# Patient Record
Sex: Female | Born: 1975
Health system: Southern US, Community
[De-identification: ages and names within clinical notes are randomized; demographics above are authoritative.]

## PROBLEM LIST (undated history)

## (undated) DIAGNOSIS — K219 Gastro-esophageal reflux disease without esophagitis: Secondary | ICD-10-CM

## (undated) DIAGNOSIS — R634 Abnormal weight loss: Secondary | ICD-10-CM

## (undated) DIAGNOSIS — G43909 Migraine, unspecified, not intractable, without status migrainosus: Secondary | ICD-10-CM

## (undated) DIAGNOSIS — D649 Anemia, unspecified: Secondary | ICD-10-CM

## (undated) DIAGNOSIS — M545 Low back pain, unspecified: Secondary | ICD-10-CM

## (undated) DIAGNOSIS — R609 Edema, unspecified: Secondary | ICD-10-CM

## (undated) DIAGNOSIS — F32A Depression, unspecified: Secondary | ICD-10-CM

## (undated) HISTORY — DX: Anemia, unspecified: D64.9

## (undated) HISTORY — DX: Low back pain, unspecified: M54.50

## (undated) HISTORY — PX: TUBAL LIGATION: SHX77

## (undated) HISTORY — DX: Abnormal weight loss: R63.4

## (undated) HISTORY — DX: Migraine, unspecified, not intractable, without status migrainosus: G43.909

## (undated) HISTORY — DX: Depression, unspecified: F32.A

## (undated) HISTORY — DX: Edema, unspecified: R60.9

## (undated) HISTORY — DX: Gastro-esophageal reflux disease without esophagitis: K21.9

---

## 2001-03-19 ENCOUNTER — Emergency Department (HOSPITAL_COMMUNITY): Admission: EM | Admit: 2001-03-19 | Discharge: 2001-03-19 | Payer: Self-pay | Admitting: *Deleted

## 2014-04-02 ENCOUNTER — Ambulatory Visit: Payer: Self-pay | Admitting: Podiatrist

## 2016-04-12 DIAGNOSIS — L84 Corns and callosities: Secondary | ICD-10-CM | POA: Insufficient documentation

## 2016-04-12 DIAGNOSIS — M2141 Flat foot [pes planus] (acquired), right foot: Secondary | ICD-10-CM | POA: Insufficient documentation

## 2016-04-12 DIAGNOSIS — M2012 Hallux valgus (acquired), left foot: Secondary | ICD-10-CM | POA: Insufficient documentation

## 2016-07-29 DIAGNOSIS — E785 Hyperlipidemia, unspecified: Secondary | ICD-10-CM | POA: Insufficient documentation

## 2016-07-29 DIAGNOSIS — R001 Bradycardia, unspecified: Secondary | ICD-10-CM | POA: Insufficient documentation

## 2021-12-18 ENCOUNTER — Other Ambulatory Visit: Payer: Self-pay

## 2021-12-18 ENCOUNTER — Emergency Department
Admission: EM | Admit: 2021-12-18 | Discharge: 2021-12-18 | Disposition: A | Discharge status: Home / Self Care | Payer: 59 | Attending: Emergency Medicine | Admitting: Emergency Medicine

## 2021-12-18 DIAGNOSIS — B372 Candidiasis of skin and nail: Secondary | ICD-10-CM | POA: Diagnosis present

## 2021-12-18 DIAGNOSIS — F17210 Nicotine dependence, cigarettes, uncomplicated: Secondary | ICD-10-CM | POA: Diagnosis not present

## 2021-12-18 MED ORDER — CLOTRIMAZOLE-BETAMETHASONE 1-0.05 % EX CREA: 1.0000 "application " | TOPICAL_CREAM | Freq: Two times a day (BID) | CUTANEOUS | 0 refills | Status: DC

## 2021-12-18 NOTE — Discharge Instructions (Addendum)
Use antibiotic cleanser given upon discharge twice a day.  Apply lotrisone twice a day and keep bandage.

## 2021-12-18 NOTE — ED Triage Notes (Signed)
Pt c/o red raised area to the right side of her abd for the past 3 days, states it blisters up and is draining

## 2021-12-18 NOTE — ED Provider Notes (Signed)
[facility] Emergency Department Provider Note   ____________________________________________   Event Date/Time   First MD Initiated Contact with Patient 12/18/21 1011     (approximate)  I have reviewed the triage vital signs and the nursing notes.   HISTORY  Chief Complaint Abscess    HPI Margaret Myers is a 46 y.o. female patient complain of red area to the right side of her abdomen for the past 3 days.  Patient state increased itching and pain.         History reviewed. No pertinent past medical history.  There are no problems to display for this patient.   Past Surgical History:  Procedure Laterality Date   TUBAL LIGATION      Prior to Admission medications   Medication Sig Start Date End Date Taking? Authorizing Provider  clotrimazole-betamethasone (LOTRISONE) cream Apply 1 application topically 2 (two) times daily. 12/18/21  Yes Joni Reining, PA-C    Allergies Patient has no known allergies.  No family history on file.  Social History Social History   Tobacco Use   Smoking status: Every Day    Types: Cigarettes   Smokeless tobacco: Never  Substance Use Topics   Alcohol use: Yes   Drug use: Not Currently    Review of Systems  Constitutional: No fever/chills Eyes: No visual changes. ENT: No sore throat. Cardiovascular: Denies chest pain. Respiratory: Denies shortness of breath. Gastrointestinal: No abdominal pain.  No nausea, no vomiting.  No diarrhea.  No constipation. Genitourinary: Negative for dysuria. Musculoskeletal: Negative for back pain. Skin: Positive for rash. Neurological: Negative for headaches, focal weakness or numbness.   ____________________________________________   PHYSICAL EXAM:  VITAL SIGNS: ED Triage Vitals  Enc Vitals Group     BP 12/18/21 0943 128/78     Pulse Rate 12/18/21 0943 72     Resp 12/18/21 0943 18     Temp 12/18/21 0943 98.3 F (36.8 C)     Temp Source 12/18/21 0943  Oral     SpO2 12/18/21 0943 97 %     Weight 12/18/21 0944 246 lb (111.6 kg)     Height 12/18/21 0944 5\' 3"  (1.6 m)     Head Circumference --      Peak Flow --      Pain Score 12/18/21 0944 5     Pain Loc --      Pain Edu? --      Excl. in GC? --    Constitutional: Alert and oriented. Well appearing and in no acute distress. Eyes: Conjunctivae are normal. PERRL. EOMI. Head: Atraumatic. Nose: No congestion/rhinnorhea. Mouth/Throat: Mucous membranes are moist.  Oropharynx non-erythematous. Neck: No stridor.  No cervical spine tenderness to palpation. Hematological/Lymphatic/Immunilogical: No cervical lymphadenopathy. Cardiovascular: Normal rate, regular rhythm. Grossly normal heart sounds.  Good peripheral circulation. Respiratory: Normal respiratory effort.  No retractions. Lungs CTAB. Gastrointestinal: Soft and nontender. No distention. No abdominal bruits. No CVA tenderness. Genitourinary:  Musculoskeletal: No lower extremity tenderness nor edema.  No joint effusions. Neurologic:  Normal speech and language. No gross focal neurologic deficits are appreciated. No gait instability. Skin: Erythematous macular lesion abdominal skin fold of abdomen.   Psychiatric: Mood and affect are normal. Speech and behavior are normal.  ____________________________________________   LABS (all labs ordered are listed, but only abnormal results are displayed)  Labs Reviewed - No data to display ____________________________________________  EKG   ____________________________________________  RADIOLOGY 12/20/21, personally viewed and evaluated these images (plain radiographs) as  part of my medical decision making, as well as reviewing the written report by the radiologist.  ED MD interpretation:    Official radiology report(s): No results found.  ____________________________________________   PROCEDURES  Procedure(s) performed (including Critical  Care):  Procedures   ____________________________________________   INITIAL IMPRESSION / ASSESSMENT AND PLAN / ED COURSE  As part of my medical decision making, I reviewed the following data within the electronic MEDICAL RECORD NUMBER  Patient presents with erythematous macular lesion right abdominal skin fold consistent with  Candida infection of the skin.  Patient given discharge care instructions and a prescription for Lotrisone.  Patient advised follow-up [facility].         ____________________________________________   FINAL CLINICAL IMPRESSION(S) / ED DIAGNOSES  Final diagnoses:  Candida infection of flexural skin     ED Discharge Orders          Ordered    clotrimazole-betamethasone (LOTRISONE) cream  2 times daily        12/18/21 1032             Note:  This document was prepared using Dragon voice recognition software and may include unintentional dictation errors.    Joni Reining, PA-C 12/18/21 1040    Dionne Bucy, MD 12/18/21 1335

## 2022-03-12 ENCOUNTER — Other Ambulatory Visit: Payer: Self-pay

## 2022-03-12 ENCOUNTER — Emergency Department
Admission: EM | Admit: 2022-03-12 | Discharge: 2022-03-12 | Disposition: A | Discharge status: Home / Self Care | Payer: 59 | Attending: Emergency Medicine | Admitting: Emergency Medicine

## 2022-03-12 ENCOUNTER — Emergency Department: Payer: 59

## 2022-03-12 ENCOUNTER — Encounter: Payer: Self-pay | Admitting: Emergency Medicine

## 2022-03-12 DIAGNOSIS — W010XXA Fall on same level from slipping, tripping and stumbling without subsequent striking against object, initial encounter: Secondary | ICD-10-CM | POA: Diagnosis not present

## 2022-03-12 DIAGNOSIS — W19XXXA Unspecified fall, initial encounter: Secondary | ICD-10-CM

## 2022-03-12 DIAGNOSIS — M25561 Pain in right knee: Secondary | ICD-10-CM | POA: Insufficient documentation

## 2022-03-12 DIAGNOSIS — Y92009 Unspecified place in unspecified non-institutional (private) residence as the place of occurrence of the external cause: Secondary | ICD-10-CM | POA: Insufficient documentation

## 2022-03-12 DIAGNOSIS — G8929 Other chronic pain: Secondary | ICD-10-CM | POA: Insufficient documentation

## 2022-03-12 MED ORDER — ACETAMINOPHEN 500 MG PO TABS
1000.0000 mg | ORAL_TABLET | Freq: Once | ORAL | Status: AC
  Administered 2022-03-12: 1000 mg via ORAL
  Filled 2022-03-12: qty 2

## 2022-03-12 MED ORDER — NAPROXEN 500 MG PO TABS
500.0000 mg | ORAL_TABLET | Freq: Once | ORAL | Status: AC
  Administered 2022-03-12: 500 mg via ORAL
  Filled 2022-03-12: qty 1

## 2022-03-12 NOTE — Discharge Instructions (Signed)
Please take Tylenol and ibuprofen/Advil for your pain.  It is safe to take them together, or to alternate them every few hours.  Take up to 1000mg of Tylenol at a time, up to 4 times per day.  Do not take more than 4000 mg of Tylenol in 24 hours.  For ibuprofen, take 400-600 mg, 4-5 times per day. ° ° °

## 2022-03-12 NOTE — ED Provider Notes (Signed)
? ?[facility] ?Provider Note ? ? ? Event Date/Time  ? First MD Initiated Contact with Patient 03/12/22 0058   ?  (approximate) ? ? ?History  ? ?Fall (/) ? ? ?HPI ? ?Margaret Myers is a 46 y.o. female who presents to the ED for evaluation of Fall (/) ?  ?Morbidly obese patient.  ? ?Patient presents to the ED for evaluation of acute on chronic right knee and lumbar pain after a fall that occurred the morning of 4/6.  She reports a mechanical fall, tripping over a raised threshold in a doorway, causing her to fall to the ground.  Denies head trauma or syncope.  Reports that she has been ambulatory since the event, but has had increasing acute on chronic pain to her right knee and lumbar back over the past 12-15 hours.  She presents with her husband who reports that she has been complaining quite a bit this afternoon, and due to her complaints just told her to come get checked out. ? ? ?Physical Exam  ? ?Triage Vital Signs: ?ED Triage Vitals  ?Enc Vitals Group  ?   BP 03/12/22 0056 115/68  ?   Pulse Rate 03/12/22 0056 87  ?   Resp 03/12/22 0056 16  ?   Temp 03/12/22 0056 97.9 ?F (36.6 ?C)  ?   Temp Source 03/12/22 0056 Oral  ?   SpO2 03/12/22 0056 98 %  ?   Weight 03/12/22 0055 240 lb (108.9 kg)  ?   Height 03/12/22 0055 5\' 3"  (1.6 m)  ?   Head Circumference --   ?   Peak Flow --   ?   Pain Score 03/12/22 0054 8  ?   Pain Loc --   ?   Pain Edu? --   ?   Excl. in GC? --   ? ? ?Most recent vital signs: ?Vitals:  ? 03/12/22 0056  ?BP: 115/68  ?Pulse: 87  ?Resp: 16  ?Temp: 97.9 ?F (36.6 ?C)  ?SpO2: 98%  ? ? ?General: Awake, no distress.  Morbidly obese.  Ambulatory back to the room independently with a normal and smooth gait. ?CV:  Good peripheral perfusion.  ?Resp:  Normal effort.  ?Abd:  No distention.  ?MSK:  No deformity noted.  No external signs of trauma.  Right knee has some mild tenderness to the medial aspect without point bony tenderness or joint line tenderness.  No lateral tenderness to  palpation over the proximal femur.  ?Back is similarly without signs of external trauma.  No bruising or step-offs.  No point bony tenderness.  Diffuse and bilateral paraspinal lumbar tenderness to palpation. ?Neuro:  No focal deficits appreciated. Cranial nerves II through XII intact ?5/5 strength and sensation in all 4 extremities ?Other:   ? ? ?ED Results / Procedures / Treatments  ? ?Labs ?(all labs ordered are listed, but only abnormal results are displayed) ?Labs Reviewed - No data to display ? ?EKG ? ? ?RADIOLOGY ?Plain films of the right knee and lumbar spine reviewed by me without evidence of fracture or dislocation. ? ?Official radiology report(s): ?DG Lumbar Spine Complete ? ?Result Date: 03/12/2022 ?CLINICAL DATA:  Fall with acute on chronic pain EXAM: LUMBAR SPINE - COMPLETE 4+ VIEW COMPARISON:  07/02/2015 FINDINGS: There is no evidence of lumbar spine fracture. Alignment is normal. Intervertebral disc spaces are maintained. IMPRESSION: Negative. Electronically Signed   By: 07/04/2015 M.D.   On: 03/12/2022 02:03  ? ?DG Knee Complete 4  Views Right ? ?Result Date: 03/12/2022 ?CLINICAL DATA:  Fall EXAM: RIGHT KNEE - COMPLETE 4+ VIEW COMPARISON:  None. FINDINGS: There is an irregular osseous fragment adjacent to the anterior surface of the lateral femoral condyle. No joint effusion. IMPRESSION: Irregular osseous fragment adjacent to the anterior surface of the lateral femoral condyle, indeterminate. Lack of joint effusion argues against acute fracture. If clinical concern for fracture, CT may be helpful. Electronically Signed   By: Deatra Robinson M.D.   On: 03/12/2022 02:02   ? ?PROCEDURES and INTERVENTIONS: ? ?Procedures ? ?Medications  ?acetaminophen (TYLENOL) tablet 1,000 mg (1,000 mg Oral Given 03/12/22 0124)  ?naproxen (NAPROSYN) tablet 500 mg (500 mg Oral Given 03/12/22 0124)  ? ? ? ?IMPRESSION / MDM / ASSESSMENT AND PLAN / ED COURSE  ?I reviewed the triage vital signs and the nursing notes. ? ?46 year old  female presents to the ED with acute on chronic MSK pain after a fall that occurred nearly 24 hours ago, without evidence of acute fracture or significant acute injuries, and suitable for outpatient management.  She look systemically well and is ambulatory with a normal gait.  Has complaints of acute on chronic pain after mechanical fall.  No syncope or signs of seizure.  Plain films of the affected areas without evidence of fracture.  Plain film of the right knee does question a possible fracture fragment of the lateral condyle of the femur, but does not clinically correlate considering her lack of lateral tenderness on palpation.  Overall reassuring evaluation and she is suitable for outpatient management.  We discussed multimodal nonnarcotic analgesia at home and return precautions for the ED. ? ?Clinical Course as of 03/12/22 0225  ?Fri Mar 12, 2022  ?0212 Reassessed and discussed x-ray results.  Reexamined and discussed the x-ray findings and lesser likelihood of a fracture considering she has no lateral pain.  She reports understanding and agreement with plan of care. [DS]  ?  ?Clinical Course User Index ?[DS] Delton Prairie, MD  ? ? ? ?FINAL CLINICAL IMPRESSION(S) / ED DIAGNOSES  ? ?Final diagnoses:  ?Fall, initial encounter  ?Chronic pain of right knee  ? ? ? ?Rx / DC Orders  ? ?ED Discharge Orders   ? ? None  ? ?  ? ? ? ?Note:  This document was prepared using Dragon voice recognition software and may include unintentional dictation errors. ?  ?Delton Prairie, MD ?03/12/22 [phone] ? ?

## 2022-03-12 NOTE — ED Triage Notes (Signed)
Pt to ED from home c/o fall tonight.  States missed a small step at home, denies hitting head or LOC.  Pain to lower back with hx of degenerative discs and also right knee pain. ?

## 2022-03-12 NOTE — ED Notes (Signed)
Patient transported to X-ray 

## 2022-03-23 ENCOUNTER — Telehealth: Payer: Self-pay

## 2022-03-23 NOTE — Telephone Encounter (Signed)
Copied from CRM #[id number]. Topic: Medical Record Request - Other ?>> Mar 23, 2022  3:04 PM Pawlus, Monica A wrote: ?Pt called in to see if the office has received his medical recrods from  [facility], please advise. ?

## 2022-03-23 NOTE — Telephone Encounter (Signed)
Copied from CRM #[id number]. Topic: Medical Record Request - Other ?>> Mar 23, 2022  3:04 PM Pawlus, Monica A wrote: ?Pt called in to see if the office has received his medical recrods from [facility], please advise. ?

## 2022-03-24 ENCOUNTER — Telehealth: Payer: Self-pay

## 2022-03-24 NOTE — Telephone Encounter (Signed)
Copied from [place] [id number]. Topic: Medical Record Request - Other ?>> Mar 23, 2022  3:04 PM Pawlus, Margaret Myers wrote: ?Pt called in to see if the office has received his medical recrods from [facility], please advise. ?

## 2022-03-26 ENCOUNTER — Telehealth: Payer: Self-pay | Admitting: Physician Assistant

## 2022-03-26 NOTE — Telephone Encounter (Signed)
Pt states [facility] sent the records were re faxed. ?

## 2022-03-26 NOTE — Telephone Encounter (Signed)
Returned patient's phone call, informing her that our office hasn't received medical Records from [facility]. Patient stated that she would follow up with [facility]. ?

## 2022-04-20 ENCOUNTER — Telehealth: Payer: Self-pay | Admitting: Pharmacy Technician

## 2022-04-20 NOTE — Telephone Encounter (Signed)
Patient only signed DOH Attestation.  Would need to provide current year's household income if PAP medications were needed. ? ?Margaret Harold J. [name] ?Patient Advocate Specialist ?[facility]  ?

## 2022-04-21 ENCOUNTER — Telehealth: Payer: Self-pay

## 2022-04-21 NOTE — Telephone Encounter (Signed)
Patient's boyfriend called and says the patient went to the ER a couple days ago in [place] for knee pain and was given a prescription for a knee brace. I asked if the patient is there to give permission to speak to him, she says yes she gives permission. He says they went to [facility] to get the brace and was told they can't take the Rx that the hospital gave per her insurance, she will need a Rx from her primary. I advised patient is not an established patient at [facility] where her records have been sent and where she has a NP appointment, advised her to go to [facility] for evaluation and they would be able to provide what is needed, advised this is an UC so she will have the same workup done. Patient verbalized she's ok with that. ?

## 2022-04-22 NOTE — Telephone Encounter (Signed)
Attempted to reach pt/recording "call cannot be completed as dialed."  If pt r/t's call please advise she should look up medical devise supply stores and check if they take her ins.  We are unable to give medical advise until she is an established pt.

## 2022-04-22 NOTE — Telephone Encounter (Signed)
Called again to find out where pt can go for a knee Brace / they went to [facility] and they do not take her Friday health insurance / they need assistance on where to go until she can get in for her NP appt / please advise asap

## 2022-04-24 ENCOUNTER — Ambulatory Visit (HOSPITAL_BASED_OUTPATIENT_CLINIC_OR_DEPARTMENT_OTHER): Payer: Self-pay | Admitting: Physical Therapy

## 2022-04-26 ENCOUNTER — Other Ambulatory Visit: Payer: Self-pay | Admitting: Physician Assistant

## 2022-04-27 ENCOUNTER — Emergency Department
Admission: EM | Admit: 2022-04-27 | Discharge: 2022-04-27 | Disposition: A | Discharge status: Home / Self Care | Payer: 59 | Attending: Emergency Medicine | Admitting: Emergency Medicine

## 2022-04-27 ENCOUNTER — Encounter: Payer: Self-pay | Admitting: Emergency Medicine

## 2022-04-27 DIAGNOSIS — R21 Rash and other nonspecific skin eruption: Secondary | ICD-10-CM | POA: Diagnosis present

## 2022-04-27 DIAGNOSIS — B372 Candidiasis of skin and nail: Secondary | ICD-10-CM | POA: Insufficient documentation

## 2022-04-27 MED ORDER — CLOTRIMAZOLE-BETAMETHASONE 1-0.05 % EX CREA
TOPICAL_CREAM | Freq: Two times a day (BID) | CUTANEOUS | Status: DC
  Filled 2022-04-27: qty 15

## 2022-04-27 MED ORDER — CLOTRIMAZOLE-BETAMETHASONE 1-0.05 % EX CREA: 1.0000 "application " | TOPICAL_CREAM | Freq: Two times a day (BID) | CUTANEOUS | 0 refills | Status: DC

## 2022-04-27 NOTE — ED Provider Notes (Signed)
[facility] Provider Note   Event Date/Time   First MD Initiated Contact with Patient 04/27/22 2125     (approximate) History  Rash  HPI Margaret Myers is a 46 y.o. female with stated past medical history of chronic knee pain and recurrence of abdominal rash who presents for recurrence of this abdominal rash.  Patient has area of erythema and maculopapular raised lesions over the right upper quadrant abdominal skin as well as overlying the anterior chest.  Patient states that she was initially given Lotrisone cream for this rash that resolved symptoms however after she ran out of the cream this rash recurred.  Patient denies any fevers, known allergens, food or travel out of the ordinary.  Patient does state that this rash significantly resolved when she was on vacation in [place] and recurred when she came home.  Patient does not have any known mold, fungal growth, or allergens in her house Physical Exam  Triage Vital Signs: ED Triage Vitals  Enc Vitals Group     BP 04/27/22 2054 (!) 106/42     Pulse Rate 04/27/22 2054 97     Resp 04/27/22 2054 17     Temp 04/27/22 2054 98.5 F (36.9 C)     Temp Source 04/27/22 2054 Oral     SpO2 04/27/22 2054 100 %     Weight 04/27/22 2115 268 lb (121.6 kg)     Height 04/27/22 2115 5\' 3"  (1.6 m)     Head Circumference --      Peak Flow --      Pain Score 04/27/22 2116 5     Pain Loc --      Pain Edu? --      Excl. in GC? --    Most recent vital signs: Vitals:   04/27/22 2054 04/27/22 2235  BP: (!) 106/42 (!) 149/89  Pulse: 97 89  Resp: 17 20  Temp: 98.5 F (36.9 C) 97.6 F (36.4 C)  SpO2: 100% 96%   General: Awake, oriented x4. CV:  Good peripheral perfusion.  Resp:  Normal effort.  Abd:  No distention.  Other:  Middle-aged obese Caucasian female laying in bed in no distress with a maculopapular rash on an erythematous base to the anterior chest and right upper quadrant abdominal wall ED Results / Procedures /  Treatments  PROCEDURES: Critical Care performed: No Procedures MEDICATIONS ORDERED IN ED: Medications  clotrimazole-betamethasone (LOTRISONE) cream ( Topical Given 04/27/22 2234)   IMPRESSION / MDM / ASSESSMENT AND PLAN / ED COURSE  I reviewed the triage vital signs and the nursing notes.                              Patient is non-toxic appearing and well hydrated. Ddx: Patients symptoms not typical for other emergent causes of rash such as cellulitis, abscess, necrotizing fasciitis, vasculitis, anaphylaxis, SJS or TENS. Appears to be recurrence of fungal infection from unknown source.  Patient states that the Lotrisone she was given previously resolved this rash and therefore will give her prescription for Lotrisone again as well as dermatology follow-up. Disposition: Patient will be discharged with strict return precautions and follow up with pediatrician within 24-48 hours for further evaluation.    FINAL CLINICAL IMPRESSION(S) / ED DIAGNOSES   Final diagnoses:  Candida infection of flexural skin   Rx / DC Orders   ED Discharge Orders  Ordered    clotrimazole-betamethasone (LOTRISONE) cream  2 times daily        04/27/22 2224           Note:  This document was prepared using Dragon voice recognition software and may include unintentional dictation errors.   Merwyn Katos, MD 04/27/22 [phone]

## 2022-04-27 NOTE — ED Triage Notes (Signed)
Pt presents with a rash on her chest and stomach - mild erythema. She notes she was seen here for it before and received a topically cream which helped but she is out of the medication. She has tried OTC benadryl without improvement to her itching. Respirations are equal and unlabored.

## 2022-06-01 ENCOUNTER — Encounter: Payer: Self-pay | Admitting: Physician Assistant

## 2022-06-01 ENCOUNTER — Ambulatory Visit (INDEPENDENT_AMBULATORY_CARE_PROVIDER_SITE_OTHER): Payer: 59 | Admitting: Physician Assistant

## 2022-06-01 VITALS — BP 115/56 | HR 61 | Temp 98.1°F | Resp 16 | Ht 63.0 in | Wt 271.5 lb

## 2022-06-01 DIAGNOSIS — F32A Depression, unspecified: Secondary | ICD-10-CM

## 2022-06-01 DIAGNOSIS — E785 Hyperlipidemia, unspecified: Secondary | ICD-10-CM | POA: Diagnosis not present

## 2022-06-01 DIAGNOSIS — F419 Anxiety disorder, unspecified: Secondary | ICD-10-CM | POA: Diagnosis not present

## 2022-06-01 DIAGNOSIS — M25561 Pain in right knee: Secondary | ICD-10-CM

## 2022-06-01 DIAGNOSIS — F325 Major depressive disorder, single episode, in full remission: Secondary | ICD-10-CM

## 2022-06-01 DIAGNOSIS — G8929 Other chronic pain: Secondary | ICD-10-CM

## 2022-06-01 DIAGNOSIS — K219 Gastro-esophageal reflux disease without esophagitis: Secondary | ICD-10-CM

## 2022-06-01 DIAGNOSIS — E1169 Type 2 diabetes mellitus with other specified complication: Secondary | ICD-10-CM

## 2022-06-01 DIAGNOSIS — K2100 Gastro-esophageal reflux disease with esophagitis, without bleeding: Secondary | ICD-10-CM

## 2022-06-01 DIAGNOSIS — M545 Low back pain, unspecified: Secondary | ICD-10-CM

## 2022-06-01 MED ORDER — GABAPENTIN 100 MG PO CAPS: ORAL_CAPSULE | ORAL | 0 refills | Status: DC

## 2022-06-01 MED ORDER — OMEPRAZOLE 20 MG PO CPDR: 20.0000 mg | DELAYED_RELEASE_CAPSULE | Freq: Every day | ORAL | 3 refills | Status: DC

## 2022-06-01 MED ORDER — GABAPENTIN 100 MG PO CAPS: 100.0000 mg | ORAL_CAPSULE | Freq: Three times a day (TID) | ORAL | 3 refills | Status: DC

## 2022-06-01 MED ORDER — CITALOPRAM HYDROBROMIDE 10 MG PO TABS: 10.0000 mg | ORAL_TABLET | Freq: Every day | ORAL | 3 refills | Status: DC

## 2022-06-01 NOTE — Progress Notes (Unsigned)
I,Brittiany Wiehe Robinson,acting as a Neurosurgeon for [employer], PA-C.,have documented all relevant documentation on the behalf of [name], PA-C,as directed by  [employer], PA-C while in the presence of [employer], PA-C.  New Patient Office Visit  Subjective    Patient ID: Margaret Myers, female    DOB: 03-Apr-1976  Age: 46 y.o. MRN: [id number]  CC:  Chief Complaint  Patient presents with   Establish Care    HPI Margaret Myers is a 46 yr old female presenting to establish care.  Patient states "I need to get back on all my meds." Recently moved from [place] and has not been on meds for approximately two years.  Gabapentin for knee pain. Citalopram, Buspirone, Omeprazole.  Reports anxiety and depression.  Recently lost mother, brother and fiance.  Having a difficult time coping.   Also complains of right knee pain. Reports "bones are rubbing together" Patient is not seeing anyone for this.  Supposed to have surgery for her knee. Last saw a medicatl provider 6 mo ago. Onset yrs ago.  States she felt in a hole in her yard and knee was injured.  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    clotrimazole-betamethasone (LOTRISONE) cream Apply 1 application. topically 2 (two) times daily.   gabapentin (NEURONTIN) 100 MG capsule 1 tab at bedtime   omeprazole (PRILOSEC) 20 MG capsule Take 1 capsule (20 mg total) by mouth daily.   [DISCONTINUED] gabapentin (NEURONTIN) 100 MG capsule Take 1 capsule (100 mg total) by mouth 3 (three) times daily.   No facility-administered encounter medications on file as of 06/01/2022.    Past Medical History:  Diagnosis Date   Anemia    Depression     Past Surgical History:  Procedure Laterality Date   TUBAL LIGATION     TUBAL LIGATION      Family History  Problem Relation Age of Onset   Depression Mother    COPD Mother    Cancer Mother    Cancer Father    Heart failure Brother     Social History   Socioeconomic History   Marital status: Divorced    Spouse name: Not on file   Number of children: Not on file   Years of education: Not on file   Highest education level: Not on file  Occupational History   Not on file  Tobacco Use   Smoking status: Every Day    Types: Cigarettes   Smokeless tobacco: Never  Substance and Sexual Activity   Alcohol use: Yes   Drug use: Not Currently   Sexual  activity: Yes    Birth control/protection: Surgical  Other Topics Concern   Not on file  Social History Narrative   ** Merged History Encounter **       Social Determinants of Health   Financial Resource Strain: Not on file  Food Insecurity: Not on file  Transportation Needs: Not on file  Physical Activity: Not on file  Stress: Not on file  Social Connections: Not on file  Intimate Partner Violence: Not on file    Review of Systems  Cardiovascular:  Positive for leg swelling.  Musculoskeletal:  Positive for back pain, joint pain and myalgias.  Neurological:  Positive for headaches.  Psychiatric/Behavioral:  Positive for depression. The patient is nervous/anxious.   All other systems reviewed and are negative.    Objective     BP (!) 115/56 (BP Location: Right Arm, Patient Position: Sitting, Cuff Size: Large)   Pulse 61   Temp 98.1 F (36.7 C) (Oral)   Resp 16   Ht 5\' 3"  (1.6 m)   Wt 271 lb 8 oz (123.2 kg)   SpO2 100%   BMI 48.09 kg/m   Physical Exam Vitals reviewed.  Constitutional:      General: She is not in acute distress.    Appearance: Normal appearance. She is well-developed. She is obese. She is not diaphoretic.  HENT:     Head: Normocephalic and atraumatic.     Right Ear: Tympanic membrane, ear canal and external ear normal.     Left Ear: Tympanic membrane, ear canal and external ear normal.     Nose: Nose normal.     Mouth/Throat:     Mouth: Mucous membranes are moist.     Pharynx: Oropharynx is clear. No oropharyngeal exudate or posterior oropharyngeal erythema.  Eyes:     General: No scleral icterus.    Extraocular Movements: Extraocular movements intact.     Conjunctiva/sclera: Conjunctivae normal.     Pupils: Pupils are equal, round, and reactive to light.  Neck:     Thyroid: No thyromegaly.  Cardiovascular:     Rate and Rhythm: Normal rate and regular rhythm.     Pulses: Normal pulses.     Heart sounds: Normal heart sounds. No murmur heard. Pulmonary:     Effort: Pulmonary effort is normal. No respiratory distress.     Breath sounds: Normal breath sounds. No wheezing, rhonchi or rales.  Musculoskeletal:        General: Normal range of motion.     Cervical back: Normal range of motion and neck supple.     Right lower leg: Edema (mild, chronic) present.     Left lower leg: Edema (mild, chronic) present.  Lymphadenopathy:     Cervical: No cervical adenopathy.  Skin:    General: Skin is warm and dry.     Capillary Refill: Capillary refill takes less than 2 seconds.     Findings: No rash.  Neurological:     General: No focal deficit present.     Mental Status: She is alert and oriented to person, place, and time. Mental status is at baseline.  Psychiatric:        Mood and  Affect: Mood normal.        Behavior: Behavior normal.        Thought Content: Thought content normal.        Judgment: Judgment normal.         Assessment & Plan:   Problem List Items Addressed This Visit  None Visit Diagnoses     Hyperlipidemia associated with type 2 diabetes mellitus (HCC)    -  Primary   Relevant Orders   CBC with Differential/Platelet (Completed)   Comprehensive metabolic panel (Completed)   Lipid panel (Completed)   Chronic pain of right knee       Relevant Medications   gabapentin (NEURONTIN) 100 MG capsule   Depression, major, single episode, complete remission (HCC)       Relevant Medications   citalopram (CELEXA) 10 MG tablet   Anxiety       Relevant Medications   citalopram (CELEXA) 10 MG tablet   Gastroesophageal reflux disease with esophagitis, unspecified whether hemorrhage       Relevant Medications   omeprazole (PRILOSEC) 20 MG capsule   Chronic bilateral low back pain without sciatica       Relevant Medications   citalopram (CELEXA) 10 MG tablet   gabapentin (NEURONTIN) 100 MG capsule   Morbid obesity (HCC)       Relevant Orders   CBC with Differential/Platelet (Completed)   Comprehensive metabolic panel (Completed)   TSH (Completed)   Lipid panel (Completed)      1. Hyperlipidemia associated with type 2 diabetes mellitus (HCC)  - CBC with Differential/Platelet - Comprehensive metabolic panel - Lipid panel  2. Chronic pain of right knee Continue her current regimen - gabapentin (NEURONTIN) 100 MG capsule; Take 1 capsule (100 mg total) by mouth 3 (three) times daily.  Dispense: 90 capsule; Refill: 3  3. Depression, major, single episode, complete remission (HCC) Continue her current medication regimen - citalopram (CELEXA) 10 MG tablet; Take 1 tablet (10 mg total) by mouth daily.  Dispense: 30 tablet; Refill: 3  4. Anxiety Chronic. Stable - citalopram (CELEXA) 10 MG tablet; Take 1 tablet (10 mg total) by mouth daily.   Dispense: 30 tablet; Refill: 3  5. Gastroesophageal reflux disease with esophagitis, unspecified whether hemorrhage Chronic. Stable. - omeprazole (PRILOSEC) 20 MG capsule; Take 1 capsule (20 mg total) by mouth daily.  Dispense: 30 capsule; Refill: 3 Elevate the head of the bed 6-8 inches, avoid recumbency for 3 hours after eating, avoid food as a delayed gastric emptying, weight loss   6. Chronic bilateral low back pain without sciatica Chronic. Stable. - gabapentin (NEURONTIN) 100 MG capsule; Take 1 capsule (100 mg total) by mouth 3 (three) times daily.  Dispense: 90 capsule; Refill: 3  7. Morbid obesity (HCC) BMI 48.09 - CBC with Differential/Platelet - Comprehensive metabolic panel - TSH - Lipid panel  FU in 2 weeks. The patient was advised to call back or seek an in-person evaluation if the symptoms worsen or if the condition fails to improve as anticipated.  I discussed the assessment and treatment plan with the patient. The patient was provided an opportunity to ask questions and all were answered. The patient agreed with the plan and demonstrated an understanding of the instructions.  The entirety of the information documented in the History of Present Illness, Review of Systems and Physical Exam were personally obtained by me. Portions of this information were initially documented by the CMA and reviewed by me for thoroughness and accuracy.  Portions of this note were created using dictation software and may contain typographical errors.   [name], [facility], MMS [facility] [phone] (phone) [phone] (fax)\

## 2022-06-02 LAB — LIPID PANEL
Chol/HDL Ratio: 3.7 ratio (ref 0.0–4.4)
Cholesterol, Total: 199 mg/dL (ref 100–199)
HDL: 54 mg/dL (ref >39)
LDL Chol Calc (NIH): 126 mg/dL — ABNORMAL HIGH (ref 0–99)
Triglycerides: 108 mg/dL (ref 0–149)
VLDL Cholesterol Cal: 19 mg/dL (ref 5–40)

## 2022-06-02 LAB — CBC WITH DIFFERENTIAL/PLATELET
Basophils Absolute: 0 10*3/uL (ref 0.0–0.2)
Basos: 1 %
EOS (ABSOLUTE): 0.2 10*3/uL (ref 0.0–0.4)
Eos: 3 %
Hematocrit: 37.9 % (ref 34.0–46.6)
Hemoglobin: 13.6 g/dL (ref 11.1–15.9)
Immature Grans (Abs): 0 10*3/uL (ref 0.0–0.1)
Immature Granulocytes: 0 %
Lymphocytes Absolute: 1.7 10*3/uL (ref 0.7–3.1)
Lymphs: 29 %
MCH: 32 pg (ref 26.6–33.0)
MCHC: 35.9 g/dL — ABNORMAL HIGH (ref 31.5–35.7)
MCV: 89 fL (ref 79–97)
Monocytes Absolute: 0.3 10*3/uL (ref 0.1–0.9)
Monocytes: 6 %
Neutrophils Absolute: 3.6 10*3/uL (ref 1.4–7.0)
Neutrophils: 61 %
Platelets: 268 10*3/uL (ref 150–450)
RBC: 4.25 x10E6/uL (ref 3.77–5.28)
RDW: 12 % (ref 11.7–15.4)
WBC: 5.8 10*3/uL (ref 3.4–10.8)

## 2022-06-02 LAB — COMPREHENSIVE METABOLIC PANEL
ALT: 11 IU/L (ref 0–32)
AST: 15 IU/L (ref 0–40)
Albumin/Globulin Ratio: 1.9 (ref 1.2–2.2)
Albumin: 4.1 g/dL (ref 3.8–4.8)
Alkaline Phosphatase: 62 IU/L (ref 44–121)
BUN/Creatinine Ratio: 16 (ref 9–23)
BUN: 13 mg/dL (ref 6–24)
Bilirubin Total: 0.3 mg/dL (ref 0.0–1.2)
CO2: 24 mmol/L (ref 20–29)
Calcium: 8.8 mg/dL (ref 8.7–10.2)
Chloride: 103 mmol/L (ref 96–106)
Creatinine, Ser: 0.83 mg/dL (ref 0.57–1.00)
Globulin, Total: 2.2 g/dL (ref 1.5–4.5)
Glucose: 97 mg/dL (ref 70–99)
Potassium: 4.9 mmol/L (ref 3.5–5.2)
Sodium: 140 mmol/L (ref 134–144)
Total Protein: 6.3 g/dL (ref 6.0–8.5)
eGFR: 87 mL/min/{1.73_m2} (ref >59)

## 2022-06-02 LAB — TSH: TSH: 2.07 u[IU]/mL (ref 0.450–4.500)

## 2022-06-02 NOTE — Progress Notes (Signed)
Hello Margaret Myers ,   Your labwork results all are within normal limits.  No changes need to be made to medications, and no further tests need to be ordered.  Any questions please reach out to the office or message me on MyChart!  Best, [name], PA-C

## 2022-06-03 DIAGNOSIS — M545 Low back pain, unspecified: Secondary | ICD-10-CM | POA: Insufficient documentation

## 2022-06-03 DIAGNOSIS — E785 Hyperlipidemia, unspecified: Secondary | ICD-10-CM | POA: Insufficient documentation

## 2022-06-03 DIAGNOSIS — G8929 Other chronic pain: Secondary | ICD-10-CM | POA: Insufficient documentation

## 2022-06-03 DIAGNOSIS — K219 Gastro-esophageal reflux disease without esophagitis: Secondary | ICD-10-CM | POA: Insufficient documentation

## 2022-06-03 DIAGNOSIS — F419 Anxiety disorder, unspecified: Secondary | ICD-10-CM | POA: Insufficient documentation

## 2022-06-03 DIAGNOSIS — M1711 Unilateral primary osteoarthritis, right knee: Secondary | ICD-10-CM | POA: Insufficient documentation

## 2022-06-03 DIAGNOSIS — F32A Depression, unspecified: Secondary | ICD-10-CM | POA: Insufficient documentation

## 2022-06-15 ENCOUNTER — Ambulatory Visit: Payer: 59 | Admitting: Physician Assistant

## 2022-06-15 DIAGNOSIS — K219 Gastro-esophageal reflux disease without esophagitis: Secondary | ICD-10-CM

## 2022-06-15 DIAGNOSIS — F419 Anxiety disorder, unspecified: Secondary | ICD-10-CM

## 2022-06-15 DIAGNOSIS — G8929 Other chronic pain: Secondary | ICD-10-CM

## 2022-06-15 DIAGNOSIS — E785 Hyperlipidemia, unspecified: Secondary | ICD-10-CM

## 2022-06-21 NOTE — Progress Notes (Deleted)
     I,Jana Dawsyn Zurn,acting as a Neurosurgeon for [employer], PA-C.,have documented all relevant documentation on the behalf of [name], PA-C,as directed by  [employer], PA-C while in the presence of [employer], PA-C.   Established patient visit   Patient: Margaret Myers   DOB: Aug 17, 1976   46 y.o. Female  MRN: [id number] Visit Date: 06/22/2022  Today's healthcare provider: [name], PA-C   No chief complaint on file.  Subjective      Medications: Outpatient Medications Prior to Visit  Medication Sig   citalopram (CELEXA) 10 MG tablet Take 1 tablet (10 mg total) by mouth daily.   clotrimazole-betamethasone (LOTRISONE) cream Apply 1 application. topically 2 (two) times daily.   gabapentin (NEURONTIN) 100 MG capsule 1 tab at bedtime   omeprazole (PRILOSEC) 20 MG capsule Take 1 capsule (20 mg total) by mouth daily.   No facility-administered medications prior to visit.    Review of Systems  {Labs  Heme  Chem  Endocrine  Serology  Results Review (optional):23779}   Objective    There were no vitals taken for this visit. {Show previous vital signs (optional):23777}  Physical Exam  ***  No results found for any visits on 06/22/22.  Assessment & Plan     ***  No follow-ups on file.      {provider attestation***:1}   [name], Cordelia Poche  [facility] [phone] (phone) [phone] (fax)  [facility]

## 2022-06-22 ENCOUNTER — Ambulatory Visit: Payer: 59 | Admitting: Physician Assistant

## 2022-06-22 DIAGNOSIS — K219 Gastro-esophageal reflux disease without esophagitis: Secondary | ICD-10-CM

## 2022-06-22 DIAGNOSIS — G4489 Other headache syndrome: Secondary | ICD-10-CM

## 2022-06-22 DIAGNOSIS — E785 Hyperlipidemia, unspecified: Secondary | ICD-10-CM

## 2022-06-22 DIAGNOSIS — M7989 Other specified soft tissue disorders: Secondary | ICD-10-CM

## 2022-06-22 DIAGNOSIS — G8929 Other chronic pain: Secondary | ICD-10-CM

## 2022-06-22 DIAGNOSIS — F419 Anxiety disorder, unspecified: Secondary | ICD-10-CM

## 2022-06-22 DIAGNOSIS — F17210 Nicotine dependence, cigarettes, uncomplicated: Secondary | ICD-10-CM

## 2022-06-29 ENCOUNTER — Encounter: Payer: Self-pay | Admitting: Physician Assistant

## 2022-06-29 ENCOUNTER — Ambulatory Visit (INDEPENDENT_AMBULATORY_CARE_PROVIDER_SITE_OTHER): Payer: 59 | Admitting: Physician Assistant

## 2022-06-29 VITALS — BP 118/71 | HR 73 | Temp 97.4°F | Resp 16 | Wt 271.6 lb

## 2022-06-29 DIAGNOSIS — E785 Hyperlipidemia, unspecified: Secondary | ICD-10-CM | POA: Diagnosis not present

## 2022-06-29 DIAGNOSIS — F419 Anxiety disorder, unspecified: Secondary | ICD-10-CM | POA: Diagnosis not present

## 2022-06-29 DIAGNOSIS — M25561 Pain in right knee: Secondary | ICD-10-CM | POA: Diagnosis not present

## 2022-06-29 DIAGNOSIS — F17200 Nicotine dependence, unspecified, uncomplicated: Secondary | ICD-10-CM

## 2022-06-29 DIAGNOSIS — G8929 Other chronic pain: Secondary | ICD-10-CM

## 2022-06-29 DIAGNOSIS — F17210 Nicotine dependence, cigarettes, uncomplicated: Secondary | ICD-10-CM | POA: Insufficient documentation

## 2022-06-29 MED ORDER — CITALOPRAM HYDROBROMIDE 20 MG PO TABS: 20.0000 mg | ORAL_TABLET | Freq: Every day | ORAL | 3 refills | Status: DC

## 2022-06-29 NOTE — Progress Notes (Unsigned)
I,Margaret Myers,acting as a Neurosurgeon for [employer], PA-C.,have documented all relevant documentation on the behalf of Margaret Lat, PA-C,as directed by  [employer], PA-C while in the presence of [employer], PA-C.   Established patient visit   Patient: Margaret Myers   DOB: February 05, 1976   46 y.o. Female  MRN: [id number] Visit Date: 06/29/2022  Today's healthcare provider: [name], PA-C   Chief Complaint  Patient presents with  . Gastroesophageal Reflux  . Knee Pain  . Depression   Subjective    Depression, Follow-up  She  was last seen for this 1 months ago. Changes made at last visit include Citalopram 10 mg.  She reports excellent compliance with treatment. She is not having side effects.  She reports good tolerance of treatment. Current symptoms include:  none   She feels she is Improved since last visit. A little bit per pt.      06/01/2022    9:34 AM  Depression screen PHQ 2/9  Decreased Interest 2  Down, Depressed, Hopeless 1  PHQ - 2 Score 3  Altered sleeping 3  Tired, decreased energy 1  Change in appetite 1  Feeling bad or failure about yourself  1  Trouble concentrating 0  Moving slowly or fidgety/restless 0  Suicidal thoughts 2  PHQ-9 Score 11  Difficult doing work/chores Somewhat difficult    -----------------------------------------------------------------------------------------  Follow up for chronic pain of right knee   The patient was last seen for this 1 months ago. Changes made at last visit include Gabapentin 100 mg. She reports good compliance with treatment. She feels that condition is Unchanged. Not helping per patient.  She is not having side effects.  Last shot was done 7 mo ago.  The 10-year ASCVD risk score (Arnett DK, et al., 2019) is: 2.6%   Values used to calculate the score:     Age: 54 years     Sex: Female     Is Non-Hispanic African American: No     Diabetic: No     Tobacco smoker: Yes     Systolic Blood  Pressure: 118 mmHg     Is BP treated: No     HDL Cholesterol: 54 mg/dL     Total Cholesterol: 199 mg/dL  ----------------------------------------------------------------------------------------- GERD, Follow up:  The patient was last seen for GERD 1 months ago. Changes made since that visit include Omeprazole 20 mg. Elevate the head of the bed 6-8 inches, avoid recumbency for 3 hours after eating, avoid food as a delayed gastric emptying, weight loss   She reports excellent compliance with treatment. Much better per patient.  She is not having side effects.      -----------------------------------------------------------------------------------------  Medications: Outpatient Medications Prior to Visit  Medication Sig  . citalopram (CELEXA) 10 MG tablet Take 1 tablet (10 mg total) by mouth daily.  . clotrimazole-betamethasone (LOTRISONE) cream Apply 1 application. topically 2 (two) times daily.  Marland Kitchen gabapentin (NEURONTIN) 100 MG capsule 1 tab at bedtime  . omeprazole (PRILOSEC) 20 MG capsule Take 1 capsule (20 mg total) by mouth daily.   No facility-administered medications prior to visit.    Review of Systems  {Labs  Heme  Chem  Endocrine  Serology  Results Review (optional):23779}   Objective    BP 118/71 (BP Location: Left Arm, Patient Position: Sitting, Cuff Size: Large)   Pulse 73   Temp (!) 97.4 F (36.3 C) (Oral)   Resp 16   Wt 271 lb 9.6 oz (123.2  kg)   SpO2 98%   BMI 48.11 kg/m  {Show previous vital signs (optional):23777}  Physical Exam  ***  No results found for any visits on 06/29/22.  Assessment & Plan     ***  No follow-ups on file.      {provider attestation***:1}   Margaret Myers, Cordelia Poche  [facility] [phone] (phone) [phone] (fax)  [facility]

## 2022-07-08 ENCOUNTER — Ambulatory Visit: Payer: 59 | Admitting: Psychiatry

## 2022-07-16 ENCOUNTER — Ambulatory Visit: Payer: Self-pay

## 2022-07-16 NOTE — Telephone Encounter (Signed)
Pt is calling to report that her L & R leg is swollen for 3 days  And red spot under her skin on her right arms     Chief Complaint: Both ankles swollen x 3 days Symptoms: Above Frequency: 3 days ago Pertinent Negatives: Patient denies redness to skin Disposition: [] ED /[] Urgent Care (no appt availability in office) / [x] Appointment(In office/virtual)/ []  [facility]/ [] Home Care/ [] Refused Recommended Disposition /[] [place] [facility]/ []  Follow-up with PCP Additional Notes: Reports she has a "red blotch to right lower arm.  Reason for Disposition  [1] MILD swelling of both ankles (i.e., pedal edema) AND [2] new-onset or worsening  Answer Assessment - Initial Assessment Questions 1. ONSET: "When did the swelling start?" (e.g., minutes, hours, days)     3 days 2. LOCATION: "What part of the leg is swollen?"  "Are both legs swollen or just one leg?"     Both legs 3. SEVERITY: "How bad is the swelling?" (e.g., localized; mild, moderate, severe)   - Localized: Small area of swelling localized to one leg.   - MILD pedal edema: Swelling limited to foot and ankle, pitting edema < 1/4 inch (6 mm) deep, rest and elevation eliminate most or all swelling.   - MODERATE edema: Swelling of lower leg to knee, pitting edema > 1/4 inch (6 mm) deep, rest and elevation only partially reduce swelling.   - SEVERE edema: Swelling extends above knee, facial or hand swelling present.      Ankles 4. REDNESS: "Does the swelling look red or infected?"     No 5. PAIN: "Is the swelling painful to touch?" If Yes, ask: "How painful is it?"   (Scale 1-10; mild, moderate or severe)     Mild 6. FEVER: "Do you have a fever?" If Yes, ask: "What is it, how was it measured, and when did it start?"      No 7. CAUSE: "What do you think is causing the leg swelling?"     Unsure 8. MEDICAL HISTORY: "Do you have a history of blood clots (e.g., DVT), cancer, heart failure, kidney disease, or liver failure?"      No 9. RECURRENT SYMPTOM: "Have you had leg swelling before?" If Yes, ask: "When was the last time?" "What happened that time?"     Yes 10. OTHER SYMPTOMS: "Do you have any other symptoms?" (e.g., chest pain, difficulty breathing)       Red skin on lower arm 11. PREGNANCY: "Is there any chance you are pregnant?" "When was your last menstrual period?"       No  Protocols used: Leg Swelling and Edema-A-AH

## 2022-07-19 ENCOUNTER — Encounter: Payer: Self-pay | Admitting: Physician Assistant

## 2022-07-19 ENCOUNTER — Ambulatory Visit (INDEPENDENT_AMBULATORY_CARE_PROVIDER_SITE_OTHER): Payer: 59 | Admitting: Physician Assistant

## 2022-07-19 VITALS — BP 128/57 | HR 79 | Temp 98.0°F | Resp 16 | Wt 271.0 lb

## 2022-07-19 DIAGNOSIS — F32A Depression, unspecified: Secondary | ICD-10-CM

## 2022-07-19 DIAGNOSIS — E785 Hyperlipidemia, unspecified: Secondary | ICD-10-CM | POA: Diagnosis not present

## 2022-07-19 DIAGNOSIS — F17200 Nicotine dependence, unspecified, uncomplicated: Secondary | ICD-10-CM

## 2022-07-19 DIAGNOSIS — M545 Low back pain, unspecified: Secondary | ICD-10-CM

## 2022-07-19 DIAGNOSIS — R233 Spontaneous ecchymoses: Secondary | ICD-10-CM

## 2022-07-19 DIAGNOSIS — M25561 Pain in right knee: Secondary | ICD-10-CM

## 2022-07-19 DIAGNOSIS — G8929 Other chronic pain: Secondary | ICD-10-CM

## 2022-07-19 DIAGNOSIS — K219 Gastro-esophageal reflux disease without esophagitis: Secondary | ICD-10-CM

## 2022-07-19 DIAGNOSIS — R21 Rash and other nonspecific skin eruption: Secondary | ICD-10-CM

## 2022-07-19 MED ORDER — GABAPENTIN 100 MG PO CAPS: 100.0000 mg | ORAL_CAPSULE | Freq: Three times a day (TID) | ORAL | 3 refills | Status: DC

## 2022-07-19 NOTE — Progress Notes (Unsigned)
I,Margaret Myers,acting as a Neurosurgeon for [employer], PA-C.,have documented all relevant documentation on the behalf of Margaret Lat, PA-C,as directed by  [employer], PA-C while in the presence of [employer], PA-C.   Established patient visit   Patient: Margaret Myers   DOB: 1976/11/10   46 y.o. Female  MRN: [id number] Visit Date: 07/19/2022  Today's healthcare provider: [name], PA-C   Chief Complaint  Patient presents with  . Leg Swelling   Subjective    Patient presents for ankle swelling x 4-5 months.  Denies redness.    Also complains of red area on lower arm/left. Noticed 1 week ago. No injury and no discomfort.   Medications: Outpatient Medications Prior to Visit  Medication Sig  . citalopram (CELEXA) 20 MG tablet Take 1 tablet (20 mg total) by mouth daily.  . clotrimazole-betamethasone (LOTRISONE) cream Apply 1 application. topically 2 (two) times daily.  Marland Kitchen gabapentin (NEURONTIN) 100 MG capsule 1 tab at bedtime  . omeprazole (PRILOSEC) 20 MG capsule Take 1 capsule (20 mg total) by mouth daily.   No facility-administered medications prior to visit.    Review of Systems  Gastrointestinal:  Positive for abdominal pain (cramps).  Genitourinary:  Positive for pelvic pain. Negative for difficulty urinating, dyspareunia, dysuria, frequency, hematuria, menstrual problem, urgency and vaginal discharge.    {Labs  Heme  Chem  Endocrine  Serology  Results Review (optional):23779}   Objective    BP (!) 128/57 (BP Location: Right Arm, Patient Position: Sitting, Cuff Size: Large)   Pulse 79   Temp 98 F (36.7 C) (Oral)   Resp 16   Wt 271 lb (122.9 kg)   SpO2 98%   BMI 48.01 kg/m  {Show previous vital signs (optional):23777}  Physical Exam Vitals reviewed.  Constitutional:      General: She is not in acute distress.    Appearance: Normal appearance. She is well-developed. She is not diaphoretic.  HENT:     Head: Normocephalic and atraumatic.   Eyes:     General: No scleral icterus.    Conjunctiva/sclera: Conjunctivae normal.  Neck:     Thyroid: No thyromegaly.  Cardiovascular:     Rate and Rhythm: Normal rate and regular rhythm.     Pulses: Normal pulses.     Heart sounds: Normal heart sounds. No murmur heard. Pulmonary:     Effort: Pulmonary effort is normal. No respiratory distress.     Breath sounds: Normal breath sounds. No wheezing, rhonchi or rales.  Musculoskeletal:     Cervical back: Neck supple.     Right lower leg: No edema.     Left lower leg: No edema.  Lymphadenopathy:     Cervical: No cervical adenopathy.  Skin:    General: Skin is warm and dry.     Findings: No rash.     Comments: Non-pruritic , slightly rased, brightly red, papules congregated into the group of 8.5 x 4.5 cm  on the right forearm  Neurological:     Mental Status: She is alert and oriented to person, place, and time. Mental status is at baseline.  Psychiatric:        Mood and Affect: Mood normal.        Behavior: Behavior normal.    No results found for any visits on 07/19/22.  Assessment & Plan     1. Hyperlipidemia, unspecified hyperlipidemia type The 10-year ASCVD risk score (Arnett DK, et al., 2019) is: 3.1%  Pt advised low-cholesterol diet  2. Morbid obesity (HCC) Lifestyle modifications advised/strongly  3. Smoking Cigarettes  Encouraged smoking cessation/strongly  4. Chronic pain of right knee  - gabapentin (NEURONTIN) 100 MG capsule; Take 1 capsule (100 mg total) by mouth 3 (three) times daily.  Dispense: 90 capsule; Refill: 3  5. Chronic bilateral low back pain without sciatica  - gabapentin (NEURONTIN) 100 MG capsule; Take 1 capsule (100 mg total) by mouth 3 (three) times daily.  Dispense: 90 capsule; Refill: 3  6. GERD without esophagitis Continue PPI Elevate the head of the bed 6-8 inches, avoid recumbency for 3 hours after eating, avoid food as a delayed gastric emptying, weight loss     7. Depression,  unspecified depression type Continue her current med  8. Easy bruising - PT and PTT  9. Rash on the Right forearm Could be due to sun exposure? PMLE? Petechiae? Recommended: Cool compresses and  drinking a lot of water, staying in air-conditioned place, limiting an exposure to sun Pt requested an appt with dermatology     Total encounter time more than 40 minutes  Greater than 50% was spent in counseling and coordination of care with the patient  CPE with Pap and breast exam/mammogram/ CXR/rough breathing? Current smoker     The patient was advised to call back or seek an in-person evaluation if the symptoms worsen or if the condition fails to improve as anticipated.  I discussed the assessment and treatment plan with the patient. The patient was provided an opportunity to ask questions and all were answered. The patient agreed with the plan and demonstrated an understanding of the instructions.  The entirety of the information documented in the History of Present Illness, Review of Systems and Physical Exam were personally obtained by me. Portions of this information were initially documented by the CMA and reviewed by me for thoroughness and accuracy.  Margaret Lat, PA-C  [facility] [phone] (phone) [phone] (fax)  [facility]

## 2022-07-20 LAB — PT AND PTT
INR: 0.9 (ref 0.9–1.2)
Prothrombin Time: 10.3 s (ref 9.1–12.0)
aPTT: 26 s (ref 24–33)

## 2022-07-20 NOTE — Progress Notes (Signed)
Hello Margaret Myers ,   Your labwork results all are within normal limits.   Any questions please reach out to the office or message me on MyChart!  Best, [name], PA-C

## 2022-07-21 ENCOUNTER — Telehealth: Payer: Self-pay

## 2022-07-21 NOTE — Telephone Encounter (Signed)
Patient called back checking status of paperwork dropped off , because she wants to donate plasma.

## 2022-07-21 NOTE — Telephone Encounter (Signed)
Copied from CRM [id number]. Topic: General - Other >> Jul 21, 2022  1:24 PM Margaret Myers wrote: Reason for CRM: The patient has called for an update on their previously dropped off paperwork  Please contact the patient to provide an update when possible

## 2022-07-22 NOTE — Telephone Encounter (Signed)
LM informing pt and to return call to office with any questions.   

## 2022-07-29 NOTE — Progress Notes (Deleted)
I,Margaret Myers,acting as a Neurosurgeon for [employer], PA-C.,have documented all relevant documentation on the behalf of Margaret Lat, PA-C,as directed by  [employer], PA-C while in the presence of [employer], PA-C.   Complete physical exam   Patient: Margaret Myers   DOB: January 14, 1976   46 y.o. Female  MRN: [id number] Visit Date: 07/30/2022  Today's healthcare provider: [name], PA-C   No chief complaint on file.  Subjective    Margaret Myers is a 46 y.o. female who presents today for a complete physical exam.  She reports consuming a {diet types:17450} diet. {Exercise:19826} She generally feels {well/fairly well/poorly:18703}. She reports sleeping {well/fairly well/poorly:18703}. She {does/does not:200015} have additional problems to discuss today.    Past Medical History:  Diagnosis Date   Anemia    Depression    Past Surgical History:  Procedure Laterality Date   TUBAL LIGATION     TUBAL LIGATION     Social History   Socioeconomic History   Marital status: Divorced    Spouse name: Not on file   Number of children: Not on file   Years of education: Not on file   Highest education level: Not on file  Occupational History   Not on file  Tobacco Use   Smoking status: Every Day    Types: Cigarettes   Smokeless tobacco: Never  Substance and Sexual Activity   Alcohol use: Yes   Drug use: Not Currently   Sexual activity: Yes    Birth control/protection: Surgical  Other Topics Concern   Not on file  Social History Narrative   ** Merged History Encounter **       Social Determinants of Health   Financial Resource Strain: Not on file  Food Insecurity: Not on file  Transportation Needs: Not on file  Physical Activity: Not on file  Stress: Not on file  Social Connections: Not on file  Intimate Partner Violence: Not on file   Family Status  Relation Name Status   Mother  (Not Specified)   Father  (Not Specified)   Brother  (Not Specified)   Family  History  Problem Relation Age of Onset   Depression Mother    COPD Mother    Cancer Mother    Cancer Father    Heart failure Brother    Allergies  Allergen Reactions   Codeine Itching and Nausea Only    Patient Care Team: Margaret Lat, PA-C as PCP - General (Physician Assistant)   Medications: Outpatient Medications Prior to Visit  Medication Sig   citalopram (CELEXA) 20 MG tablet Take 1 tablet (20 mg total) by mouth daily.   clotrimazole-betamethasone (LOTRISONE) cream Apply 1 application. topically 2 (two) times daily.   gabapentin (NEURONTIN) 100 MG capsule Take 1 capsule (100 mg total) by mouth 3 (three) times daily.   omeprazole (PRILOSEC) 20 MG capsule Take 1 capsule (20 mg total) by mouth daily.   No facility-administered medications prior to visit.    Review of Systems  {Labs  Heme  Chem  Endocrine  Serology  Results Review (optional):23779}  Objective    There were no vitals taken for this visit. {Show previous vital signs (optional):23777}   Physical Exam  ***  Last depression screening scores    06/01/2022    9:34 AM  PHQ 2/9 Scores  PHQ - 2 Score 3  PHQ- 9 Score 11   Last fall risk screening    06/01/2022    9:34 AM  Fall Risk  Falls in the past year? 1  Number falls in past yr: 0  Injury with Fall? 1  Follow up Falls evaluation completed   Last Audit-C alcohol use screening    06/01/2022    9:36 AM  Alcohol Use Disorder Test (AUDIT)  1. How often do you have a drink containing alcohol? 0  2. How many drinks containing alcohol do you have on a typical day when you are drinking? 0  3. How often do you have six or more drinks on one occasion? 0  AUDIT-C Score 0   A score of 3 or more in women, and 4 or more in men indicates increased risk for alcohol abuse, EXCEPT if all of the points are from question 1   No results found for any visits on 07/30/22.  Assessment & Plan    Routine Health Maintenance and Physical Exam  Exercise  Activities and Dietary recommendations  Goals   None      There is no immunization history on file for this patient.  Health Maintenance  Topic Date Due   COVID-19 Vaccine (1) Never done   HIV Screening  Never done   Hepatitis C Screening  Never done   TETANUS/TDAP  Never done   PAP SMEAR-Modifier  Never done   COLONOSCOPY (Pts 45-67yrs Insurance coverage will need to be confirmed)  Never done   INFLUENZA VACCINE  07/06/2022   HPV VACCINES  Aged Out    Discussed health benefits of physical activity, and encouraged her to engage in regular exercise appropriate for her age and condition.  ***  No follow-ups on file.     {provider attestation***:1}   Margaret Myers, Cordelia Poche  [facility] [phone] (phone) [phone] (fax)  [facility]

## 2022-07-30 ENCOUNTER — Encounter: Payer: 59 | Admitting: Physician Assistant

## 2022-07-30 ENCOUNTER — Telehealth: Payer: Self-pay

## 2022-07-30 DIAGNOSIS — F32A Depression, unspecified: Secondary | ICD-10-CM

## 2022-07-30 DIAGNOSIS — E785 Hyperlipidemia, unspecified: Secondary | ICD-10-CM

## 2022-07-30 DIAGNOSIS — G8929 Other chronic pain: Secondary | ICD-10-CM

## 2022-07-30 DIAGNOSIS — F17200 Nicotine dependence, unspecified, uncomplicated: Secondary | ICD-10-CM

## 2022-07-30 DIAGNOSIS — R21 Rash and other nonspecific skin eruption: Secondary | ICD-10-CM

## 2022-07-30 DIAGNOSIS — F419 Anxiety disorder, unspecified: Secondary | ICD-10-CM

## 2022-07-30 NOTE — Telephone Encounter (Signed)
Please advise 

## 2022-07-30 NOTE — Telephone Encounter (Signed)
Copied from CRM [id number]. Topic: General - Other >> Jul 29, 2022  4:21 PM Everette C wrote: Reason for CRM: The patient has called for an update on their previously submitted paperwork for plasma donation  Please contact the patient further when possible

## 2022-08-04 ENCOUNTER — Ambulatory Visit: Payer: 59 | Admitting: Physician Assistant

## 2022-08-11 ENCOUNTER — Ambulatory Visit: Payer: 59 | Admitting: Physician Assistant

## 2022-08-11 NOTE — Progress Notes (Deleted)
     I,Margaret Myers,acting as a Neurosurgeon for [employer], PA-C.,have documented all relevant documentation on the behalf of Margaret Lat, PA-C,as directed by  [employer], PA-C while in the presence of [employer], PA-C.   Established patient visit   Patient: Margaret Myers   DOB: 15-Jun-1976   46 y.o. Female  MRN: [id number] Visit Date: 08/11/2022  Today's healthcare provider: [name], PA-C   No chief complaint on file.  Subjective    Follow up for red spots right arm   The patient was last seen for this 3 weeks ago.  Changes made at last visit include/labs normal.  Requesting form for clearance to donate plasma.   She reports {excellent/good/fair/poor:19665} compliance with treatment. She feels that condition is {improved/worse/unchanged:3041574}. She {is/is not:21021397} having side effects. ***  ----------------------------------------------------------------------------------------- \  Medications: Outpatient Medications Prior to Visit  Medication Sig   citalopram (CELEXA) 20 MG tablet Take 1 tablet (20 mg total) by mouth daily.   clotrimazole-betamethasone (LOTRISONE) cream Apply 1 application. topically 2 (two) times daily.   gabapentin (NEURONTIN) 100 MG capsule Take 1 capsule (100 mg total) by mouth 3 (three) times daily.   omeprazole (PRILOSEC) 20 MG capsule Take 1 capsule (20 mg total) by mouth daily.   No facility-administered medications prior to visit.    Review of Systems  {Labs  Heme  Chem  Endocrine  Serology  Results Review (optional):23779}   Objective    There were no vitals taken for this visit. {Show previous vital signs (optional):23777}  Physical Exam  ***  No results found for any visits on 08/11/22.  Assessment & Plan     ***  No follow-ups on file.      {provider attestation***:1}   Margaret Myers, Cordelia Poche  [facility] [phone] (phone) [phone] (fax)  [facility]

## 2022-08-19 ENCOUNTER — Encounter: Payer: 59 | Admitting: Physician Assistant

## 2022-08-19 ENCOUNTER — Encounter: Payer: Self-pay | Admitting: Physician Assistant

## 2022-08-19 NOTE — Progress Notes (Deleted)
I,Travarius Lange S Lamis Behrmann,acting as a Education administrator for [employer], PA-C.,have documented all relevant documentation on the behalf of Mardene Speak, PA-C,as directed by  [employer], PA-C while in the presence of [employer], PA-C.    Complete physical exam   Patient: Margaret Myers   DOB: [birth year]   46 y.o. Female  MRN: [id number] Visit Date: 08/19/2022  Today's healthcare provider: Mardene Speak, PA-C   No chief complaint on file.  Subjective    Margaret Myers is a 46 y.o. female who presents today for a complete physical exam.  She reports consuming a {diet types:17450} diet. {Exercise:19826} She generally feels {well/fairly well/poorly:18703}. She reports sleeping {well/fairly well/poorly:18703}. She {does/does not:200015} have additional problems to discuss today.  HPI    Past Medical History:  Diagnosis Date   Anemia    Depression    Past Surgical History:  Procedure Laterality Date   TUBAL LIGATION     TUBAL LIGATION     Social History   Socioeconomic History   Marital status: Divorced    Spouse name: Not on file   Number of children: Not on file   Years of education: Not on file   Highest education level: Not on file  Occupational History   Not on file  Tobacco Use   Smoking status: Every Day    Types: Cigarettes   Smokeless tobacco: Never  Substance and Sexual Activity   Alcohol use: Yes   Drug use: Not Currently   Sexual activity: Yes    Birth control/protection: Surgical  Other Topics Concern   Not on file  Social History Narrative   ** Merged History Encounter **       Social Determinants of Health   Financial Resource Strain: Not on file  Food Insecurity: Not on file  Transportation Needs: Not on file  Physical Activity: Not on file  Stress: Not on file  Social Connections: Not on file  Intimate Partner Violence: Not on file   Family Status  Relation Name Status   Mother  (Not Specified)   Father  (Not Specified)   Brother  (Not Specified)    Family History  Problem Relation Age of Onset   Depression Mother    COPD Mother    Cancer Mother    Cancer Father    Heart failure Brother    Allergies  Allergen Reactions   Codeine Itching and Nausea Only    Patient Care Team: Mardene Speak, PA-C as PCP - General (Physician Assistant)   Medications: Outpatient Medications Prior to Visit  Medication Sig   citalopram (CELEXA) 20 MG tablet Take 1 tablet (20 mg total) by mouth daily.   clotrimazole-betamethasone (LOTRISONE) cream Apply 1 application. topically 2 (two) times daily.   gabapentin (NEURONTIN) 100 MG capsule Take 1 capsule (100 mg total) by mouth 3 (three) times daily.   omeprazole (PRILOSEC) 20 MG capsule Take 1 capsule (20 mg total) by mouth daily.   No facility-administered medications prior to visit.    Review of Systems  All other systems reviewed and are negative.   Last CBC Lab Results  Component Value Date   WBC 5.8 06/01/2022   HGB 13.6 06/01/2022   HCT 37.9 06/01/2022   MCV 89 06/01/2022   MCH 32.0 06/01/2022   RDW 12.0 06/01/2022   PLT 268 29/19/1660   Last metabolic panel Lab Results  Component Value Date   GLUCOSE 97 06/01/2022   NA 140 06/01/2022   K 4.9 06/01/2022   CL 103  06/01/2022   CO2 24 06/01/2022   BUN 13 06/01/2022   CREATININE 0.83 06/01/2022   EGFR 87 06/01/2022   CALCIUM 8.8 06/01/2022   PROT 6.3 06/01/2022   ALBUMIN 4.1 06/01/2022   LABGLOB 2.2 06/01/2022   AGRATIO 1.9 06/01/2022   BILITOT 0.3 06/01/2022   ALKPHOS 62 06/01/2022   AST 15 06/01/2022   ALT 11 06/01/2022   Last lipids Lab Results  Component Value Date   CHOL 199 06/01/2022   HDL 54 06/01/2022   LDLCALC 126 (H) 06/01/2022   TRIG 108 06/01/2022   CHOLHDL 3.7 06/01/2022   Last thyroid functions Lab Results  Component Value Date   TSH 2.070 06/01/2022      Objective    There were no vitals taken for this visit. BP Readings from Last 3 Encounters:  07/19/22 (!) 128/57  06/29/22 118/71   06/01/22 (!) 115/56   Wt Readings from Last 3 Encounters:  07/19/22 271 lb (122.9 kg)  06/29/22 271 lb 9.6 oz (123.2 kg)  06/01/22 271 lb 8 oz (123.2 kg)       Physical Exam  ***  Last depression screening scores    06/01/2022    9:34 AM  PHQ 2/9 Scores  PHQ - 2 Score 3  PHQ- 9 Score 11   Last fall risk screening    06/01/2022    9:34 AM  Fall Risk   Falls in the past year? 1  Number falls in past yr: 0  Injury with Fall? 1  Follow up Falls evaluation completed   Last Audit-C alcohol use screening    06/01/2022    9:36 AM  Alcohol Use Disorder Test (AUDIT)  1. How often do you have a drink containing alcohol? 0  2. How many drinks containing alcohol do you have on a typical day when you are drinking? 0  3. How often do you have six or more drinks on one occasion? 0  AUDIT-C Score 0   A score of 3 or more in women, and 4 or more in men indicates increased risk for alcohol abuse, EXCEPT if all of the points are from question 1   No results found for any visits on 08/19/22.  Assessment & Plan    Routine Health Maintenance and Physical Exam  Exercise Activities and Dietary recommendations  Goals   None      There is no immunization history on file for this patient.  Health Maintenance  Topic Date Due   COVID-19 Vaccine (1) Never done   HIV Screening  Never done   Hepatitis C Screening  Never done   TETANUS/TDAP  Never done   PAP SMEAR-Modifier  Never done   COLONOSCOPY (Pts 45-75yr Insurance coverage will need to be confirmed)  Never done   INFLUENZA VACCINE  Never done   HPV VACCINES  Aged Out    Discussed health benefits of physical activity, and encouraged her to engage in regular exercise appropriate for her age and condition.  ***  No follow-ups on file.     {provider attestation***:1}   JMardene Speak PHershal Coria B[phone](phone) [phone](fax)  [place]

## 2022-08-24 ENCOUNTER — Encounter: Payer: Self-pay | Admitting: Physician Assistant

## 2022-08-31 ENCOUNTER — Ambulatory Visit: Payer: 59 | Admitting: Physician Assistant

## 2022-08-31 NOTE — Progress Notes (Deleted)
      Established patient visit   Patient: Margaret Myers   DOB: 06/17/1976   46 y.o. Female  MRN: [id number] Visit Date: 08/31/2022  Today's healthcare provider: Mardene Speak, PA-C   No chief complaint on file.  Subjective    Knee Pain  The pain is present in the right knee.   ***  Medications: Outpatient Medications Prior to Visit  Medication Sig  . citalopram (CELEXA) 20 MG tablet Take 1 tablet (20 mg total) by mouth daily.  . clotrimazole-betamethasone (LOTRISONE) cream Apply 1 application. topically 2 (two) times daily.  Marland Kitchen gabapentin (NEURONTIN) 100 MG capsule Take 1 capsule (100 mg total) by mouth 3 (three) times daily.  Marland Kitchen omeprazole (PRILOSEC) 20 MG capsule Take 1 capsule (20 mg total) by mouth daily.   No facility-administered medications prior to visit.    Review of Systems  {Labs  Heme  Chem  Endocrine  Serology  Results Review (optional):23779}   Objective    There were no vitals taken for this visit. {Show previous vital signs (optional):23777}  Physical Exam  ***  No results found for any visits on 08/31/22.  Assessment & Plan     ***  No follow-ups on file.      {provider attestation***:1}   Mardene Speak, Hershal Coria  [facility] [phone] (phone) [phone] (fax)  [place]

## 2022-09-07 ENCOUNTER — Ambulatory Visit: Payer: Self-pay | Admitting: Psychiatry

## 2022-10-07 ENCOUNTER — Other Ambulatory Visit: Payer: Self-pay

## 2022-10-07 ENCOUNTER — Encounter: Payer: Self-pay | Admitting: Emergency Medicine

## 2022-10-07 ENCOUNTER — Emergency Department
Admission: EM | Admit: 2022-10-07 | Discharge: 2022-10-08 | Disposition: A | Discharge status: Home / Self Care | Payer: Self-pay | Attending: Emergency Medicine | Admitting: Emergency Medicine

## 2022-10-07 ENCOUNTER — Emergency Department: Payer: Self-pay

## 2022-10-07 DIAGNOSIS — X58XXXA Exposure to other specified factors, initial encounter: Secondary | ICD-10-CM | POA: Insufficient documentation

## 2022-10-07 DIAGNOSIS — J029 Acute pharyngitis, unspecified: Secondary | ICD-10-CM

## 2022-10-07 DIAGNOSIS — R09A2 Foreign body sensation, throat: Secondary | ICD-10-CM

## 2022-10-07 DIAGNOSIS — T189XXA Foreign body of alimentary tract, part unspecified, initial encounter: Secondary | ICD-10-CM | POA: Insufficient documentation

## 2022-10-07 MED ORDER — ALUM & MAG HYDROXIDE-SIMETH 200-200-20 MG/5ML PO SUSP
30.0000 mL | Freq: Once | ORAL | Status: AC
  Administered 2022-10-07: 30 mL via ORAL
  Filled 2022-10-07: qty 30

## 2022-10-07 MED ORDER — MAGIC MOUTHWASH: ORAL | 0 refills | Status: DC

## 2022-10-07 MED ORDER — DEXAMETHASONE 10 MG/ML FOR PEDIATRIC ORAL USE
10.0000 mg | Freq: Once | INTRAMUSCULAR | Status: AC
  Administered 2022-10-08: 10 mg via ORAL
  Filled 2022-10-07: qty 1

## 2022-10-07 MED ORDER — LIDOCAINE VISCOUS HCL 2 % MT SOLN
15.0000 mL | Freq: Once | OROMUCOSAL | Status: AC
  Administered 2022-10-07: 15 mL via ORAL
  Filled 2022-10-07: qty 15

## 2022-10-07 MED ORDER — MAGIC MOUTHWASH
10.0000 mL | Freq: Once | ORAL | Status: AC
  Administered 2022-10-08: 10 mL via ORAL
  Filled 2022-10-07: qty 10

## 2022-10-07 NOTE — Discharge Instructions (Addendum)
You may use Magic mouthwash as needed for throat discomfort.  Return to the ER for worsening symptoms, persistent vomiting, difficulty breathing or other concerns.

## 2022-10-07 NOTE — ED Provider Notes (Signed)
-----------------------------------------   11:52 PM on 10/07/2022 -----------------------------------------   CT Neck interpreted per Dr. Jeannine Boga:  Mucosal edema involving the left pharynx and aryepiglottic fold,  which could reflect changes of acute pharyngitis or possibly  posttraumatic changes related to recent foreign body ingestion. No  visible retained foreign body within the neck.   Updated patient on CT scan results.  She does endorse a several day history of sore throat.  We will add Magic mouthwash and single dose Decadron now.  Will discharge home with Magic mouthwash to use as needed.  Strict return precautions given.  Patient verbalizes understanding agrees with plan of care.   Paulette Blanch, MD 10/08/22 [phone]

## 2022-10-07 NOTE — ED Notes (Signed)
Pt is A&Ox4. Pt is red in the face and spitting up. Pt says the wire that she swallowed now feels like it is in the middle of her throat. Pt told we are waiting on CT results at this time.

## 2022-10-07 NOTE — ED Provider Notes (Signed)
   [facility] Provider Note    Event Date/Time   First MD Initiated Contact with Patient 10/07/22 2224     (approximate)  History   Chief Complaint: Foreign Body  HPI  Margaret Myers is a 46 y.o. female with a past medical history of anemia presents emergency department after a accidental swallowed foreign body.  According to the patient she was trying to a stripped a covering of a metal wire when she accidentally swallowed a piece of metal wire.  Patient states she tried to make herself vomit and states she tried to use a plastic spoon to get the piece of wire but feels like it is just out of reach in the upper throat.  Patient denies any pain in her chest or abdomen.  No shortness of breath.   Physical Exam   Triage Vital Signs: ED Triage Vitals  Enc Vitals Group     BP --      Pulse Rate 10/07/22 2225 79     Resp 10/07/22 2225 18     Temp 10/07/22 2225 97.9 F (36.6 C)     Temp Source 10/07/22 2225 Oral     SpO2 10/07/22 2225 98 %     Weight 10/07/22 2226 280 lb (127 kg)     Height 10/07/22 2226 5\' 3"  (1.6 m)     Head Circumference --      Peak Flow --      Pain Score 10/07/22 2226 10     Pain Loc --      Pain Edu? --      Excl. in [place]? --     Most recent vital signs: Vitals:   10/07/22 2225  Pulse: 79  Resp: 18  Temp: 97.9 F (36.6 C)  SpO2: 98%    General: Awake, no distress.  CV:  Good peripheral perfusion.  Regular rate and rhythm  Resp:  Normal effort.  Equal breath sounds bilaterally.  Abd:  No distention.  Soft, nontender.  No rebound or guarding. Other:  Widely patent oropharynx and no obvious foreign body identified following visual inspection.   ED Results / Procedures / Treatments   MEDICATIONS ORDERED IN ED: Medications - No data to display   IMPRESSION / MDM / [place] / ED COURSE  I reviewed the triage vital signs and the nursing notes.  Patient's presentation is most consistent with acute presentation  with potential threat to life or bodily function.  Patient presents emergency department after accidentally swallowing a metal wire.  Patient states it feels like it is stuck in her left upper throat.  On my examination I do not see any obvious abnormality but the patient states she feels like it is stuck slightly lower than what I can see.  We will obtain a CT scan without contrast to further evaluate.  Differential would include foreign body, esophageal or pharyngeal abrasion.  Patient agreeable to plan.  I have reviewed and interpreted the CT images of the neck I do not see any obvious foreign body on my evaluation.  We will dose a GI cocktail.  FINAL CLINICAL IMPRESSION(S) / ED DIAGNOSES   Accidental swallowed foreign body    Note:  This document was prepared using Dragon voice recognition software and may include unintentional dictation errors.   Harvest Dark, MD 10/12/22 1505

## 2022-10-07 NOTE — ED Triage Notes (Addendum)
Patient ambulatory to triage with steady gait, without difficulty, face flushed, freq clearing throat and spitting, voice hoarse; st hr PTA was holding wire with her teeth to strip it and swallowed small piece of wire and feels that it is lodged in left side of throat

## 2022-10-08 MED ORDER — HYDROCODONE-ACETAMINOPHEN 7.5-325 MG/15ML PO SOLN
10.0000 mL | Freq: Once | ORAL | Status: AC
  Administered 2022-10-08: 10 mL via ORAL
  Filled 2022-10-08: qty 15

## 2023-02-09 ENCOUNTER — Encounter: Payer: Self-pay | Admitting: Emergency Medicine

## 2023-02-09 ENCOUNTER — Emergency Department: Payer: Medicaid Other

## 2023-02-09 ENCOUNTER — Emergency Department
Admission: EM | Admit: 2023-02-09 | Discharge: 2023-02-09 | Disposition: A | Discharge status: Home / Self Care | Payer: Medicaid Other | Attending: Emergency Medicine | Admitting: Emergency Medicine

## 2023-02-09 DIAGNOSIS — M25561 Pain in right knee: Secondary | ICD-10-CM | POA: Insufficient documentation

## 2023-02-09 DIAGNOSIS — R21 Rash and other nonspecific skin eruption: Secondary | ICD-10-CM | POA: Diagnosis not present

## 2023-02-09 DIAGNOSIS — G8929 Other chronic pain: Secondary | ICD-10-CM | POA: Diagnosis not present

## 2023-02-09 DIAGNOSIS — L299 Pruritus, unspecified: Secondary | ICD-10-CM | POA: Insufficient documentation

## 2023-02-09 MED ORDER — PERMETHRIN 5 % EX CREA: 1.0000 | TOPICAL_CREAM | Freq: Once | CUTANEOUS | 0 refills | Status: AC

## 2023-02-09 MED ORDER — ACETAMINOPHEN 325 MG PO TABS
650.0000 mg | ORAL_TABLET | Freq: Once | ORAL | Status: AC
  Administered 2023-02-09: 650 mg via ORAL
  Filled 2023-02-09: qty 2

## 2023-02-09 NOTE — ED Notes (Signed)
Pt discharge to home. Pt VSS, GCS 15, NAD. Pt verbalized understanding of discharge instructions with no additional questions at this time.

## 2023-02-09 NOTE — ED Triage Notes (Signed)
Pt endorses right knee pain and swelling for about 2 weeks. Also reports a rash to abd for same time. Also wanting her right ear checked.

## 2023-02-09 NOTE — ED Notes (Signed)
Knee brace applied to right knee.

## 2023-02-09 NOTE — ED Provider Notes (Signed)
[facility] Provider Note    Event Date/Time   First MD Initiated Contact with Patient 02/09/23 1120     (approximate)   History   Knee Pain and Rash   HPI  Margaret Myers is a 47 y.o. female with a past medical history of morbid obesity, anxiety, back pain, depression, hyperlipidemia who presents today for evaluation of right knee pain as well as rash to her abdomen.  She reports that the rash is very itchy.  She reports that she has been staying at a shelter.  She denies any specific injuries to her knees, reports that it has burning hurting for a long time, but recently she has been doing more walking than normal.  She is requesting a new brace.  She denies any changes noted to her knee.  She is still able to ambulate.  Patient Active Problem List   Diagnosis Date Noted   Smoking 06/29/2022   Chronic pain of right knee 06/03/2022   Anxiety 06/03/2022   Chronic bilateral low back pain without sciatica 06/03/2022   Morbid obesity ([place]) 06/03/2022   GERD without esophagitis 06/03/2022   Depression 06/03/2022   Hyperlipidemia 06/03/2022          Physical Exam   Triage Vital Signs: ED Triage Vitals [02/09/23 0946]  Enc Vitals Group     BP 122/62     Pulse Rate 68     Resp 16     Temp 98.4 F (36.9 C)     Temp Source Oral     SpO2 97 %     Weight 275 lb (124.7 kg)     Height '5\' 3"'$  (1.6 m)     Head Circumference      Peak Flow      Pain Score 8     Pain Loc      Pain Edu?      Excl. in [place]?     Most recent vital signs: Vitals:   02/09/23 0946  BP: 122/62  Pulse: 68  Resp: 16  Temp: 98.4 F (36.9 C)  SpO2: 97%    Physical Exam Vitals and nursing note reviewed.  Constitutional:      General: Awake and alert. No acute distress.    Appearance: Normal appearance. The patient is obese.  HENT:     Head: Normocephalic and atraumatic.     Mouth: Mucous membranes are moist.  Eyes:     General: PERRL. Normal EOMs        Right eye: No  discharge.        Left eye: No discharge.     Conjunctiva/sclera: Conjunctivae normal.  Cardiovascular:     Rate and Rhythm: Normal rate and regular rhythm.     Pulses: Normal pulses.  Pulmonary:     Effort: Pulmonary effort is normal. No respiratory distress.     Breath sounds: Normal breath sounds.  Abdominal:     Abdomen is soft. There is no abdominal tenderness. No rebound or guarding. No distention. Musculoskeletal:        General: No swelling. Normal range of motion.     Cervical back: Normal range of motion and neck supple.  Right knee: No deformity or rash.  Mild anterior joint line tenderness. No patellar tenderness, no ballotment Warm and well perfused extremity with 2+ pedal pulses 5/5 strength to dorsiflexion and plantarflexion at the ankle with intact sensation throughout extremity Normal range of motion of the knee, with intact flexion and extension  to active and passive range of motion. Extensor mechanism intact. No ligamentous laxity. Negative anterior/posterior drawer/negative lachman, negative mcmurrays No effusion or warmth Intact quadriceps, hamstring function, patellar tendon function Pelvis stable Full ROM of ankle without pain or swelling Foot warm and well perfused Skin:    General: Skin is warm and dry.     Capillary Refill: Capillary refill takes less than 2 seconds.     Findings: Linear excoriations noted to lower abdomen, no surrounding erythema.  No discharge or drainage.  No satellite lesions.  No vesicles or bullae. Neurological:     Mental Status: The patient is awake and alert.      ED Results / Procedures / Treatments   Labs (all labs ordered are listed, but only abnormal results are displayed) Labs Reviewed - No data to display   EKG     RADIOLOGY I independently reviewed and interpreted imaging and agree with radiologists findings.     PROCEDURES:  Critical Care performed:   Procedures   MEDICATIONS ORDERED IN  ED: Medications  acetaminophen (TYLENOL) tablet 650 mg (650 mg Oral Given 02/09/23 1131)     IMPRESSION / MDM / ASSESSMENT AND PLAN / ED COURSE  I reviewed the triage vital signs and the nursing notes.   Differential diagnosis includes, but is not limited to, effusion, sprain, contusion, dislocation, fracture, joint infection, tendon rupture, scabies, Candida/intertrigo.  Patient is awake and alert, hemodynamically stable and afebrile.  She is nontoxic in appearance.  She is able to ambulate.  No evidence of neurological deficit or vascular compromise on exam. No fracture/dislocation on X-Ray. No deformity or obvious ligamentous laxity on exam. No constitutional symptoms, warmth, or erythema to suggest septic joint.  X-ray does reveal tricompartmental degenerative changes, likely contributing to her symptoms today.  As for her rash, given her linear excoriations and the fact that she has been sleeping in a shelter, will treat for scabies.  No satellite lesions or erythema to suggest Candida.  No erythema to suggest cellulitis.  No vesicles or bullae.  No skin sloughing.  Overall well appearing, vital signs stable. Return precautions and care instructions discussed. Outpatient follow-up advised. Patient agrees with plan of care.  Patient's presentation is most consistent with acute complicated illness / injury requiring diagnostic workup.     FINAL CLINICAL IMPRESSION(S) / ED DIAGNOSES   Final diagnoses:  Chronic pain of right knee  Rash and nonspecific skin eruption     Rx / DC Orders   ED Discharge Orders          Ordered    permethrin (ELIMITE) 5 % cream   Once        02/09/23 1124             Note:  This document was prepared using Dragon voice recognition software and may include unintentional dictation errors.   Emeline Gins 02/09/23 1408    Rada Hay, MD 02/09/23 [phone]

## 2023-02-09 NOTE — Discharge Instructions (Addendum)
Use the cream as prescribed for your rash.  Your x-ray does not show any broken bones, though you have arthritis.  You were given a knee brace, which you may use when you are walking, remove it at night.  Please return for any new, worsening, or change in symptoms or other concerns.  It was a pleasure caring for you today.

## 2023-02-28 ENCOUNTER — Encounter: Payer: Self-pay | Admitting: General Practice

## 2023-03-07 ENCOUNTER — Ambulatory Visit (INDEPENDENT_AMBULATORY_CARE_PROVIDER_SITE_OTHER): Payer: Medicaid Other | Admitting: Physician Assistant

## 2023-03-07 ENCOUNTER — Encounter: Payer: Self-pay | Admitting: Physician Assistant

## 2023-03-07 VITALS — BP 130/78 | HR 62 | Temp 98.4°F | Ht 63.0 in | Wt 268.0 lb

## 2023-03-07 DIAGNOSIS — F32A Depression, unspecified: Secondary | ICD-10-CM | POA: Diagnosis not present

## 2023-03-07 DIAGNOSIS — F17210 Nicotine dependence, cigarettes, uncomplicated: Secondary | ICD-10-CM

## 2023-03-07 DIAGNOSIS — Z6841 Body Mass Index (BMI) 40.0 and over, adult: Secondary | ICD-10-CM

## 2023-03-07 DIAGNOSIS — F419 Anxiety disorder, unspecified: Secondary | ICD-10-CM | POA: Diagnosis not present

## 2023-03-07 DIAGNOSIS — K219 Gastro-esophageal reflux disease without esophagitis: Secondary | ICD-10-CM

## 2023-03-07 DIAGNOSIS — M545 Low back pain, unspecified: Secondary | ICD-10-CM

## 2023-03-07 DIAGNOSIS — L299 Pruritus, unspecified: Secondary | ICD-10-CM

## 2023-03-07 DIAGNOSIS — B353 Tinea pedis: Secondary | ICD-10-CM

## 2023-03-07 DIAGNOSIS — M25561 Pain in right knee: Secondary | ICD-10-CM

## 2023-03-07 DIAGNOSIS — G43909 Migraine, unspecified, not intractable, without status migrainosus: Secondary | ICD-10-CM

## 2023-03-07 DIAGNOSIS — E66813 Obesity, class 3: Secondary | ICD-10-CM

## 2023-03-07 DIAGNOSIS — G8929 Other chronic pain: Secondary | ICD-10-CM

## 2023-03-07 DIAGNOSIS — F1991 Other psychoactive substance use, unspecified, in remission: Secondary | ICD-10-CM

## 2023-03-07 MED ORDER — BUSPIRONE HCL 5 MG PO TABS: 5.0000 mg | ORAL_TABLET | Freq: Two times a day (BID) | ORAL | 1 refills | Status: AC

## 2023-03-07 MED ORDER — CITALOPRAM HYDROBROMIDE 20 MG PO TABS: 20.0000 mg | ORAL_TABLET | Freq: Every day | ORAL | 1 refills | Status: DC

## 2023-03-07 MED ORDER — SUMATRIPTAN SUCCINATE 50 MG PO TABS: 50.0000 mg | ORAL_TABLET | Freq: Every day | ORAL | 2 refills | Status: DC | PRN

## 2023-03-07 MED ORDER — CLOTRIMAZOLE-BETAMETHASONE 1-0.05 % EX CREA: 1.0000 | TOPICAL_CREAM | Freq: Two times a day (BID) | CUTANEOUS | 0 refills | Status: AC

## 2023-03-07 MED ORDER — GABAPENTIN 300 MG PO CAPS: 300.0000 mg | ORAL_CAPSULE | Freq: Three times a day (TID) | ORAL | 3 refills | Status: DC

## 2023-03-07 MED ORDER — OMEPRAZOLE 20 MG PO CPDR: 20.0000 mg | DELAYED_RELEASE_CAPSULE | Freq: Every day | ORAL | 3 refills | Status: AC | PRN

## 2023-03-07 NOTE — Progress Notes (Signed)
Date:  03/07/2023   Name:  Margaret Myers   DOB:  05/14/1976   MRN:  [id number]   Chief Complaint: Establish Care (Has not taken any or her medications for 3 months, was getting medication filled at [facility] pt was homeless and missed a few appointments and they could no longer see the pt, wants to switch from gabapentin to lyrica) and Weight Management Screening (Pt wants a refill on phentermine)  HPI Margaret Myers is a pleasant 47 year old female with a history of depression, anxiety, GERD, right knee arthritis, chronic back pain, obesity, and illicit drug use who presents new to the clinic today to establish care and obtain refills for medications.  She has a history of homelessness but is currently staying with a family member (her uncle).  Ran out of medications about 3 months ago.  Was seeing [facility], but due to multiple missed appointments she was discharged from their care.  Previously saw at [facility] in [place]. Was seeing [facility] for a while as well, mainly for her right knee pain, but also reports degenerative disc in the back, takes gabapentin for both of these problems, asks if she can take Lyrica which a friend told her works better.  Reports also taking phentermine for about 6 months which patient says was last prescribed by [facility], but PMP is completely blank.  I have reviewed the last 3 notes from prior PCP with no mention of phentermine in the active medication list, and no prescription for phentermine found in the notes...  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  Last used crack 4 weeks ago and is proud of herself for stopping.  Denies any history of IV drug abuse.  Obesity - chronic issue for her, likely contributing to knee and back pain.  Struggles with sugar sweetened beverages, specifically [facility].  Tells me that it is  typical for her to drink 6 bottles (16.9 oz) daily.  Recently she has switched to sweet tea which she makes at home with 1.5 cups sugar per pitcher - still drinks about 5-6 glasses per day.  Does some yard work but no routine physical activity.  Also complains of right ear itching, uses a Bobby pin to scratch the ear canal.  Worried she may have damaged her eardrum, wants me to take a look today.     Recent Labs     Component Value Date/Time   NA 140 06/01/2022 1039   K 4.9 06/01/2022 1039   CL 103 06/01/2022 1039   CO2 24 06/01/2022 1039   GLUCOSE 97 06/01/2022 1039   BUN 13 06/01/2022 1039   CREATININE 0.83 06/01/2022 1039   CALCIUM 8.8 06/01/2022 1039   PROT 6.3 06/01/2022 1039   ALBUMIN 4.1 06/01/2022 1039   AST 15 06/01/2022 1039   ALT 11 06/01/2022 1039   ALKPHOS 62 06/01/2022 1039   BILITOT 0.3 06/01/2022 1039    Lab Results  Component Value Date   WBC 5.8 06/01/2022   HGB 13.6 06/01/2022   HCT 37.9 06/01/2022   MCV 89 06/01/2022   PLT 268 06/01/2022   No results found for: "HGBA1C" Lab Results  Component Value Date   CHOL 199 06/01/2022   HDL 54 06/01/2022   LDLCALC 126 (H) 06/01/2022   TRIG 108 06/01/2022   CHOLHDL 3.7 06/01/2022   Lab Results  Component Value Date   TSH 2.070 06/01/2022  Review of Systems  Patient Active Problem List   Diagnosis Date Noted   History of illicit drug use [id number]   Tinea pedis of both feet 03/07/2023   Migraine without status migrainosus, not intractable 03/07/2023   Continuous dependence on cigarette smoking 06/29/2022   Chronic pain of right knee 06/03/2022   Anxiety 06/03/2022   Chronic bilateral low back pain without sciatica 06/03/2022   Class 3 severe obesity with serious comorbidity and body mass index (BMI) of 45.0 to 49.9 in adult 06/03/2022   GERD without esophagitis 06/03/2022   Depression 06/03/2022   Hyperlipidemia 06/03/2022   Bradycardia 07/29/2016   Pes planus of both feet 04/12/2016   Hallux  valgus, acquired, bilateral 04/12/2016   Callus of foot 04/12/2016    Allergies  Allergen Reactions   Codeine Itching, Nausea Only and Other (See Comments)    Past Surgical History:  Procedure Laterality Date   TUBAL LIGATION     TUBAL LIGATION      Social History   Tobacco Use   Smoking status: Every Day    Packs/day: 0.50    Years: 15.00    Additional pack years: 0.00    Total pack years: 7.50    Types: Cigarettes   Smokeless tobacco: Never  Vaping Use   Vaping Use: Some days   Start date: 03/06/2022  Substance Use Topics   Alcohol use: Yes   Drug use: Not Currently    Types: "Crack" cocaine, Methylphenidate     Medication list has been reviewed and updated.  Current Meds  Medication Sig   busPIRone (BUSPAR) 5 MG tablet Take 1 tablet (5 mg total) by mouth 2 (two) times daily.   Ferrous Sulfate (IRON PO) Take by mouth 2 (two) times a week.   [DISCONTINUED] busPIRone HCl (BUSPAR PO) Take by mouth.       03/07/2023    8:56 AM 06/01/2022    9:22 AM  GAD 7 : Generalized Anxiety Score  Nervous, Anxious, on Edge 3 2  Control/stop worrying 3 3  Worry too much - different things 3 3  Trouble relaxing 1 3  Restless 0 3  Easily annoyed or irritable 0 3  Afraid - awful might happen 0 3  Total GAD 7 Score 10 20  Anxiety Difficulty Not difficult at all Somewhat difficult       03/07/2023    8:55 AM 06/01/2022    9:34 AM  Depression screen PHQ 2/9  Decreased Interest 1 2  Down, Depressed, Hopeless 1 1  PHQ - 2 Score 2 3  Altered sleeping 0 3  Tired, decreased energy 1 1  Change in appetite 0 1  Feeling bad or failure about yourself  0 1  Trouble concentrating 0 0  Moving slowly or fidgety/restless 0 0  Suicidal thoughts 0 2  PHQ-9 Score 3 11  Difficult doing work/chores Not difficult at all Somewhat difficult    BP Readings from Last 3 Encounters:  03/07/23 130/78  02/09/23 122/62  10/08/22 (!) 140/72     Wt Readings from Last 3 Encounters:  03/07/23  268 lb (121.6 kg)  02/09/23 275 lb (124.7 kg)  10/07/22 280 lb (127 kg)    BP 130/78   Pulse 62   Temp 98.4 F (36.9 C) (Oral)   Ht 5\' 3"  (1.6 m)   Wt 268 lb (121.6 kg)   LMP 02/26/2023 (Approximate)   SpO2 95%   BMI 47.47 kg/m   Physical Exam Vitals and nursing note  reviewed.  Constitutional:      Appearance: Normal appearance.  HENT:     Ears:     Comments: TMs appear normal, though right TM is only partially visible.  Within the right ear canal there are red excoriations with skin debris, no active bleeding. Cardiovascular:     Rate and Rhythm: Normal rate and regular rhythm.     Heart sounds: No murmur heard.    No friction rub. No gallop.     Comments: Borderline bradycardia Pulmonary:     Effort: Pulmonary effort is normal.     Breath sounds: Wheezing present.     Comments: Faint wheezing posterior lungs Feet:     Right foot:     Toenail Condition: Fungal disease present.    Left foot:     Toenail Condition: Fungal disease present.    Comments: 1 cm callus of the right forefoot.  Mild tinea pedis bilaterally.  Mild onychomycosis bilaterally.     Assessment and Plan:  1. Anxiety Restart usual medications for now.  Comorbid obesity.  Plan to revisit these medications in the future and consider switch to fluoxetine which is weight neutral. - citalopram (CELEXA) 20 MG tablet; Take 1 tablet (20 mg total) by mouth daily.  Dispense: 90 tablet; Refill: 1 - busPIRone (BUSPAR) 5 MG tablet; Take 1 tablet (5 mg total) by mouth 2 (two) times daily.  Dispense: 180 tablet; Refill: 1  2. Depression, unspecified depression type Restart usual medications for now.  Comorbid obesity.  Plan to revisit these medications in the future and consider switch to fluoxetine which is weight neutral.  3. Class 3 severe obesity with serious comorbidity and body mass index (BMI) of 45.0 to 49.9 in adult, unspecified obesity type Discussed with patient briefly today.  Explained that this is  a complex disease which deserves its own office visit.  However, we were able to identify some high-priority areas for improvement, namely consumption of sugar sweetened beverages.  We set 2 goals today for her to achieve by next visit in a month.  First, I want her to reduce consumption of sugar sweetened beverages to no more than 4 per day.  I also want her to walk for about 10 minutes at least 2-3 times per week, which she feels she can achieve.  Obesogenic medications including citalopram, buspirone, gabapentin will be a topic of future conversation.    Patient is not a candidate for phentermine, which I explained is really only intended for short-term use anyways, but she also has a history of illicit stimulant use within the last month.  Additionally, I see no record of this medication being prescribed to her within the last year  4. Chronic pain of right knee Review of recent right knee x-ray 02/09/2023 shows "age advanced tricompartmental degenerative changes with small knee joint effusion".  Patient states gabapentin helps with this problem, but I think topical or oral NSAIDs may be a better alternative.  Discussed with patient I do not want to use Lyrica in her case because it is a controlled substance.  Instead will try to increase gabapentin dose to 300 mg up to 3 times per day as needed. - gabapentin (NEURONTIN) 300 MG capsule; Take 1 capsule (300 mg total) by mouth 3 (three) times daily.  Dispense: 90 capsule; Refill: 3  5. Chronic bilateral low back pain without sciatica Review of lumbar spine x-ray from 03/12/2022 after a fall reveals "no evidence of lumbar spine fracture.  Alignment is normal.  Intervertebral disc spaces are maintained."  I do not see any other back x-rays for this patient, but will request records from [facility].  Patient states gabapentin helps with this problem, but I think topical or oral NSAIDs may be a better alternative.  Discussed with patient I do not want  to use Lyrica in her case because it is a controlled substance.  Instead will try to increase gabapentin dose to 300 mg up to 3 times per day as needed. - gabapentin (NEURONTIN) 300 MG capsule; Take 1 capsule (300 mg total) by mouth 3 (three) times daily.  Dispense: 90 capsule; Refill: 3  6. GERD without esophagitis Refill omeprazole for as needed use.  Patient does not feel like she needs to use it every day. - omeprazole (PRILOSEC) 20 MG capsule; Take 1 capsule (20 mg total) by mouth daily as needed.  Dispense: 30 capsule; Refill: 3  7. Migraine without status migrainosus, not intractable, unspecified migraine type Refill sumatriptan for as needed use.  I strongly suspect stress, caffeine, smoking contributory. - SUMAtriptan (IMITREX) 50 MG tablet; Take 1 tablet (50 mg total) by mouth daily as needed for migraine.  Dispense: 10 tablet; Refill: 2  8. Tinea pedis of both feet Refill Lotrisone for as needed use.  Mild on exam today. - clotrimazole-betamethasone (LOTRISONE) cream; Apply 1 Application topically 2 (two) times daily.  Dispense: 45 g; Refill: 0  9. History of illicit drug use Congratulated on 4-weeks sober.  Encouraged continued abstinence from illicit substances.  Patient is not a candidate for phentermine.  10. Ear itch Excoriated right ear canal.  Discouraged use of Bobby pins within the ear canal.  Consider small volume unscented lotion or pertroleum jelly applied to the ear canal to aid in comfort and healing.  Do not use Q-tips deep within the canal.  11. Cigarette Smoking Encouraged cessation, but patient is mainly focused on continued abstinence from crack at this time which I think is reasonable for now  Return in about 4 weeks (around 04/04/2023) for pain, weight, fasting lab.   Partially dictated using Editor, commissioning. Any errors are unintentional.  Lupita Leash, PA-C, [facility]MS[facility], [place] [phone]

## 2023-04-01 ENCOUNTER — Encounter: Payer: Self-pay | Admitting: Physician Assistant

## 2023-04-04 ENCOUNTER — Ambulatory Visit: Payer: Medicaid Other | Admitting: Physician Assistant

## 2023-04-05 ENCOUNTER — Ambulatory Visit: Payer: Medicaid Other | Admitting: Physician Assistant

## 2023-04-13 ENCOUNTER — Other Ambulatory Visit: Payer: Self-pay | Admitting: Physician Assistant

## 2023-04-13 ENCOUNTER — Telehealth: Payer: Self-pay

## 2023-04-13 ENCOUNTER — Encounter: Payer: Self-pay | Admitting: Physician Assistant

## 2023-04-13 ENCOUNTER — Ambulatory Visit (INDEPENDENT_AMBULATORY_CARE_PROVIDER_SITE_OTHER): Payer: BLUE CROSS/BLUE SHIELD | Admitting: Physician Assistant

## 2023-04-13 VITALS — BP 104/76 | HR 71 | Temp 98.5°F | Ht 63.0 in | Wt 265.0 lb

## 2023-04-13 DIAGNOSIS — F17210 Nicotine dependence, cigarettes, uncomplicated: Secondary | ICD-10-CM

## 2023-04-13 DIAGNOSIS — Z6841 Body Mass Index (BMI) 40.0 and over, adult: Secondary | ICD-10-CM

## 2023-04-13 DIAGNOSIS — F1991 Other psychoactive substance use, unspecified, in remission: Secondary | ICD-10-CM

## 2023-04-13 DIAGNOSIS — L02214 Cutaneous abscess of groin: Secondary | ICD-10-CM

## 2023-04-13 DIAGNOSIS — G8929 Other chronic pain: Secondary | ICD-10-CM

## 2023-04-13 DIAGNOSIS — M25561 Pain in right knee: Secondary | ICD-10-CM

## 2023-04-13 DIAGNOSIS — F320 Major depressive disorder, single episode, mild: Secondary | ICD-10-CM | POA: Diagnosis not present

## 2023-04-13 MED ORDER — FLUOXETINE HCL 20 MG PO TABS: 20.0000 mg | ORAL_TABLET | Freq: Every day | ORAL | 1 refills | Status: AC

## 2023-04-13 MED ORDER — SULFAMETHOXAZOLE-TRIMETHOPRIM 800-160 MG PO TABS: 1.0000 | ORAL_TABLET | Freq: Two times a day (BID) | ORAL | 0 refills | Status: AC

## 2023-04-13 NOTE — Patient Instructions (Signed)
-  It was a pleasure to see you today! Please review your visit summary for helpful information -I would encourage you to follow your care via MyChart where you can access lab results, notes, messages, and more -If you feel that we did a nice job today, please complete your after-visit survey and leave [place] a Google review! Your CMA today was Mariann Barter and your provider was Alvester Morin, PA-C, D[facility] -Please return for follow-up in about 4 weeks  -Stop Celexa (citalopram) -Start fluoxetine. We may need to bump up dose next time.  -Taper off gabapentin since it is not helping -Reduce sugar intake through beverages -Try to incorporate physical activity that does not bother joints. Options include water-based activities (e.g. aerobics), elliptical, recumbent bike, resistance bands, chair yoga -wait for call from orthopedics

## 2023-04-13 NOTE — Progress Notes (Signed)
Date:  04/13/2023   Name:  Margaret Myers   DOB:  [birth year]   MRN:  [id number]   Chief Complaint: Knee Pain, Back Pain, and Weight Loss (Wants to get back on phentermine hasn't taken in years )  HPI Anaaya presents today for transition of care from ED visit 04/10/2023 (TOC call completed by Laurian Brim, CMA on 04/13/23) for abscess of left groin, she was prescribed Bactrim but apparently was sent to the wrong pharmacy so she has not started.  I have reordered Bactrim and sent to the correct pharmacy this morning prior to today's visit.  She says the abscess is better but not healed. Has a new spot on her right buttock she'd like me to look at.   She also returns for 1 month follow-up on chronic conditions including depression, anxiety, obesity, and age advanced osteoarthritis of the right knee.  She seeks orthopedic referral.  Fall injury to right knee at home several years ago, worse over the last 3 months, pain is variable and described as aching, burning, cramping, shooting, stabbing.  Severity is up to 8/10 at times.  Previous therapies include ice, NSAIDs, acetaminophen, and joint injections with minimal/temporary relief.  Despite gabapentin previously helping, she no longer finds benefit from this medication and is interested in stopping.  Also has chronic back pain of the lumbar spine, reports DDD, most recent lumbar x-ray that I could find was unremarkable.  Again requests phentermine for weight loss, despite our previous conversation last visit that this would be an inappropriate medication given her recent history of illicit stimulant abuse and concurrent anxiety.  She reports that she has been sober from "crack" for the past 2 months.  She is reduced [employer] consumption from 6 bottles (16.9 oz) daily to 2-3 bottles daily, but substituted with sweet tea consumption which she makes at home and endorses consuming about half-pitcher per day.  Working in the garden some, but no routine  physical activity.  Still smoking.  Celexa not helping anx/dep.  Medication list has been reviewed and updated.  Current Meds  Medication Sig   busPIRone (BUSPAR) 5 MG tablet Take 1 tablet (5 mg total) by mouth 2 (two) times daily.   citalopram (CELEXA) 20 MG tablet Take 1 tablet (20 mg total) by mouth daily.   clotrimazole-betamethasone (LOTRISONE) cream Apply 1 Application topically 2 (two) times daily.   Ferrous Sulfate (IRON PO) Take by mouth 2 (two) times a week.   gabapentin (NEURONTIN) 300 MG capsule Take 1 capsule (300 mg total) by mouth 3 (three) times daily.   omeprazole (PRILOSEC) 20 MG capsule Take 1 capsule (20 mg total) by mouth daily as needed.   sulfamethoxazole-trimethoprim (BACTRIM DS) 800-160 MG tablet Take 1 tablet by mouth 2 (two) times daily for 7 days.   SUMAtriptan (IMITREX) 50 MG tablet Take 1 tablet (50 mg total) by mouth daily as needed for migraine.     Review of Systems  Constitutional:  Negative for fatigue and fever.  Respiratory:  Negative for chest tightness and shortness of breath.   Cardiovascular:  Negative for chest pain and palpitations.  Gastrointestinal:  Negative for abdominal pain.  Musculoskeletal:  Positive for arthralgias and back pain.  Skin:        "Abscess" of left groin, right buttock  Psychiatric/Behavioral:  Positive for dysphoric mood. The patient is nervous/anxious.     Patient Active Problem List   Diagnosis Date Noted   History of illicit drug use 03/07/2023  Tinea pedis of both feet 03/07/2023   Migraine without status migrainosus, not intractable 03/07/2023   Continuous dependence on cigarette smoking 06/29/2022   Chronic pain of right knee 06/03/2022   Anxiety 06/03/2022   Chronic bilateral low back pain without sciatica 06/03/2022   Class 3 severe obesity with serious comorbidity and body mass index (BMI) of 45.0 to 49.9 in adult [facility]) 06/03/2022   GERD without esophagitis 06/03/2022   Depression 06/03/2022    Hyperlipidemia 06/03/2022   Bradycardia 07/29/2016   Pes planus of both feet 04/12/2016   Hallux valgus, acquired, bilateral 04/12/2016   Callus of foot 04/12/2016    Allergies  Allergen Reactions   Codeine Itching, Nausea Only and Other (See Comments)     There is no immunization history on file for this patient.  Past Surgical History:  Procedure Laterality Date   TUBAL LIGATION     TUBAL LIGATION      Social History   Tobacco Use   Smoking status: Every Day    Packs/day: 0.50    Years: 15.00    Additional pack years: 0.00    Total pack years: 7.50    Types: Cigarettes   Smokeless tobacco: Never  Vaping Use   Vaping Use: Some days   Start date: 03/06/2022  Substance Use Topics   Alcohol use: Yes   Drug use: Not Currently    Types: "Crack" cocaine, Methamphetamines       04/13/2023   11:21 AM 03/07/2023    8:56 AM 06/01/2022    9:22 AM  GAD 7 : Generalized Anxiety Score  Nervous, Anxious, on Edge 2 3 2   Control/stop worrying 2 3 3   Worry too much - different things 2 3 3   Trouble relaxing 1 1 3   Restless 1 0 3  Easily annoyed or irritable 3 0 3  Afraid - awful might happen 0 0 3  Total GAD 7 Score 11 10 20   Anxiety Difficulty  Not difficult at all Somewhat difficult       04/13/2023   11:21 AM 03/07/2023    8:55 AM 06/01/2022    9:34 AM  Depression screen PHQ 2/9  Decreased Interest 1 1 2   Down, Depressed, Hopeless 1 1 1   PHQ - 2 Score 2 2 3   Altered sleeping 2 0 3  Tired, decreased energy 1 1 1   Change in appetite 0 0 1  Feeling bad or failure about yourself  0 0 1  Trouble concentrating 1 0 0  Moving slowly or fidgety/restless 1 0 0  Suicidal thoughts 0 0 2  PHQ-9 Score 7 3 11   Difficult doing work/chores Not difficult at all Not difficult at all Somewhat difficult    BP Readings from Last 3 Encounters:  04/13/23 104/76  03/07/23 130/78  02/09/23 122/62    Wt Readings from Last 3 Encounters:  04/13/23 265 lb (120.2 kg)  03/07/23 268 lb  (121.6 kg)  02/09/23 275 lb (124.7 kg)    BP 104/76   Pulse 71   Temp 98.5 F (36.9 C) (Oral)   Ht 5\' 3"  (1.6 m)   Wt 265 lb (120.2 kg)   SpO2 95%   BMI 46.94 kg/m   Physical Exam Vitals and nursing note reviewed.  Constitutional:      Appearance: Normal appearance.  Cardiovascular:     Rate and Rhythm: Normal rate and regular rhythm.     Heart sounds: No murmur heard.    No friction rub. No gallop.  Comments: No carotid bruit Pulmonary:     Effort: Pulmonary effort is normal.     Breath sounds: Normal breath sounds. No wheezing or rales.  Skin:    Comments: On the right buttock there is a 5 mm tender red papule with surrounding erythema of about 3.5 cm diameter - no fluctuance, crusting, or discharge.  In the left inguinal crease just lateral to left labia there is a 1 cm area of pink skin breakdown without active abscess/fluctuance. The area is nontender. Just superior to this is a small 5mm cystic mass favoring lymph node.      Recent Labs     Component Value Date/Time   NA 140 06/01/2022 1039   K 4.9 06/01/2022 1039   CL 103 06/01/2022 1039   CO2 24 06/01/2022 1039   GLUCOSE 97 06/01/2022 1039   BUN 13 06/01/2022 1039   CREATININE 0.83 06/01/2022 1039   CALCIUM 8.8 06/01/2022 1039   PROT 6.3 06/01/2022 1039   ALBUMIN 4.1 06/01/2022 1039   AST 15 06/01/2022 1039   ALT 11 06/01/2022 1039   ALKPHOS 62 06/01/2022 1039   BILITOT 0.3 06/01/2022 1039    Lab Results  Component Value Date   WBC 5.8 06/01/2022   HGB 13.6 06/01/2022   HCT 37.9 06/01/2022   MCV 89 06/01/2022   PLT 268 06/01/2022   No results found for: "HGBA1C" Lab Results  Component Value Date   CHOL 199 06/01/2022   HDL 54 06/01/2022   LDLCALC 126 (H) 06/01/2022   TRIG 108 06/01/2022   CHOLHDL 3.7 06/01/2022   Lab Results  Component Value Date   TSH 2.070 06/01/2022     Assessment and Plan:  1. Abscess of left groin Sounds like this is improved per patient report, no concerning  findings on exam.  Patient reassured that the spot on the right buttock also not concerning, looks like an insect bite to me of some kind, no need for incision/drainage.  Take Bactrim as prescribed, prescription sent to the correct pharmacy this morning.  Might also use topical antibiotic given the broken skin in the left inguinal crease.  2. Class 3 severe obesity with serious comorbidity and body mass index (BMI) of 45.0 to 49.9 in adult, unspecified obesity type [facility]) Patient advised of 3 pound weight loss over the last month, 10 pound weight loss over the last 2 months, congratulated on this.  Explained that there is still much room for improvement regarding sugar sweetened beverage consumption; though she has decreased Mountain Dew consumption, she has replaced with sweet tea which is only marginally better.  Estimated about 1000 daily calories from sugars in beverages alone, explained importance of this to patient.   We discussed possibilities for routine physical activity through no impact, low weight-bearing activities. Options include water-based activities (e.g. aerobics), elliptical, recumbent bike, resistance bands, chair yoga  Will wait for patient to commit to lifestyle adjustments before considering pharmacologic therapy for obesity. She will not be a candidate for any stimulants at any point moving forward which I again explained to her.    3. Current mild episode of major depressive disorder, unspecified whether recurrent (HCC) Given the inadequate efficacy of Celexa and potential obesogenicity, switch to fluoxetine which is weight neutral.  This may also help with some of her chronic pain.  Begin with starting dose, may need dose increase at future visit. - FLUoxetine (PROZAC) 20 MG tablet; Take 1 tablet (20 mg total) by mouth daily.  Dispense: 30 tablet;  Refill: 1  4. Chronic pain of right knee Initiating orthopedic referral for consideration of right knee arthroplasty.  In the  meantime, continue topical and oral analgesics as tolerated.  Since gabapentin is not working, recommended taper off this medication over the next 2 weeks.  Weight loss likely to help with joint pain.  Also chronic inflammation associated with obesity, high sugar diet, and cigarette smoking likely contributing to her pain.  - Ambulatory referral to Orthopedic Surgery  5. Continuous dependence on cigarette smoking Patient still not ready to quit, but encouraged nonetheless  6. History of illicit drug use Congratulated on 2 months sober from illicit stimulants, encouraged continued avoidance.   F/u 4wk OV dep/weight  Partially dictated using Animal nutritionist. Any errors are unintentional.  Alvester Morin, PA-C, DMSc, Nutritionist [facility] Primary Care and Sports Medicine [facility] [phone]

## 2023-04-13 NOTE — Transitions of Care (Post Inpatient/ED Visit) (Signed)
   04/13/2023  Name: Margaret Myers MRN: [id number] DOB: [birth year]  Today's TOC FU Call Status: Today's TOC FU Call Status:: Successful TOC FU Call Competed TOC FU Call Complete Date: 04/13/23  Transition Care Management Follow-up Telephone Call Date of Discharge: 04/10/23 Discharge Facility:  [facility] ED) Type of Discharge: Emergency Department How have you been since you were released from the hospital?: Better Any questions or concerns?: Yes Patient Questions/Concerns:: feels better still hurting some Patient Questions/Concerns Addressed: Provided Patient Educational Materials  Items Reviewed: Did you receive and understand the discharge instructions provided?: Yes Medications obtained,verified, and reconciled?: Yes (Medications Reviewed) Any new allergies since your discharge?: No Dietary orders reviewed?: No Do you have support at home?: Yes  Medications Reviewed Today: Medications Reviewed Today     Reviewed by Amenah Tucci, Eulis Canner, CMA (Certified Medical Assistant) on 03/07/23 at [phone]  Med List Status: <None>   Medication Order Taking? Sig Documenting Provider Last Dose Status Informant  busPIRone HCl (BUSPAR PO) [id number] Yes Take by mouth. [provider] Taking Active   citalopram (CELEXA) 20 MG tablet [id number] No Take 1 tablet (20 mg total) by mouth daily.  Patient not taking: Reported on 03/07/2023   [name], PA-C Not Taking Active Pharmacy Records  clotrimazole-betamethasone [facility]) cream [id number] No Apply 1 application. topically 2 (two) times daily.  Patient not taking: Reported on 03/07/2023   Merwyn Katos, MD Not Taking Active Pharmacy Records  Ferrous Sulfate (IRON PO) [id number] Yes Take by mouth 2 (two) times a week. [provider] Taking Active   gabapentin (NEURONTIN) 100 MG capsule [id number] No Take 1 capsule (100 mg total) by mouth 3 (three) times daily.  Patient not taking: Reported on 03/07/2023   [name], PA-C Not Taking Active  Pharmacy Records  omeprazole (PRILOSEC) 20 MG capsule [id number] No Take 1 capsule (20 mg total) by mouth daily.  Patient not taking: Reported on 03/07/2023   [name], PA-C Not Taking Active Pharmacy Records  phentermine 37.5 MG capsule [id number] No Take by mouth.  Patient not taking: Reported on 03/07/2023   [provider] Not Taking Active   SUMAtriptan (IMITREX) 50 MG tablet [id number] No Take by mouth.  Patient not taking: Reported on 03/07/2023   [provider] Not Taking Active             Home Care and Equipment/Supplies: Were Home Health Services Ordered?: No Any new equipment or medical supplies ordered?: No  Functional Questionnaire: Do you need assistance with bathing/showering or dressing?: No Do you need assistance with meal preparation?: No Do you need assistance with eating?: No Do you have difficulty maintaining continence: No Do you need assistance with getting out of bed/getting out of a chair/moving?: No Do you have difficulty managing or taking your medications?: No  Follow up appointments reviewed: PCP Follow-up appointment confirmed?: Yes Date of PCP follow-up appointment?: 04/13/23 Follow-up Provider: [place] Specialist [facility] Follow-up appointment confirmed?: No Do you need transportation to your follow-up appointment?: Yes Transportation Need Intervention Addressed By:: Transportation Arranged Do you understand care options if your condition(s) worsen?: Yes-patient verbalized understanding    SIGNATURE Mariann Barter Brantley Wiley, CMA

## 2023-04-18 ENCOUNTER — Ambulatory Visit (INDEPENDENT_AMBULATORY_CARE_PROVIDER_SITE_OTHER): Payer: Medicaid Other | Admitting: Family Medicine

## 2023-04-18 ENCOUNTER — Encounter: Payer: Self-pay | Admitting: Family Medicine

## 2023-04-18 VITALS — BP 104/78 | HR 50 | Ht 63.0 in | Wt 264.0 lb

## 2023-04-18 DIAGNOSIS — M7918 Myalgia, other site: Secondary | ICD-10-CM

## 2023-04-18 DIAGNOSIS — G8929 Other chronic pain: Secondary | ICD-10-CM | POA: Diagnosis not present

## 2023-04-18 DIAGNOSIS — M1711 Unilateral primary osteoarthritis, right knee: Secondary | ICD-10-CM

## 2023-04-18 MED ORDER — DULOXETINE HCL 30 MG PO CPEP: 30.0000 mg | ORAL_CAPSULE | Freq: Every evening | ORAL | 0 refills | Status: DC

## 2023-04-18 MED ORDER — DULOXETINE HCL 30 MG PO CPEP: 30.0000 mg | ORAL_CAPSULE | Freq: Every evening | ORAL | 0 refills | Status: AC

## 2023-04-18 NOTE — Assessment & Plan Note (Signed)
Chronic, ongoing symptomatology. Prior treatments have included numerous corticosteroid injections with diminishing responses, limited to no response from NSAIDs, prior rehab endorsed. Pain anteromedial without radiation, affecting ADLs.  Examination with no effusion, ROM 0-110, limited by pain anteriorly, medial and patellar facet tenderness, no laxity with A/P and valgus/varus stressing, equivocal McMurrays.  We extensively reviewed both surgical and non-surgical treatments after a review of her recent right knee x-ray with interpretation. Plan as follows: - Initiate duloxetine 30 mg, discussed comorbid fluoxetine and possible titration down if needed, goal would be titration of duloxetine to 60 mg for MSK symptom control - Return for evaluation with consideration of genicular nerve block - Patient to maintain follow-up with orthopedic surgery given her overall clinical course, discussed MI criteria with target BMI 35 and below

## 2023-04-18 NOTE — Progress Notes (Signed)
     Primary Care / Sports Medicine Office Visit  Patient Information:  Patient ID: Margaret Myers, female DOB: 05/05/1976 Age: 47 y.o. MRN: [id number]   Beena Eleby is a pleasant 47 y.o. female presenting with the following:  Chief Complaint  Patient presents with   Knee Pain    Vitals:   04/18/23 1004  BP: 104/78  Pulse: (!) 50  SpO2: 98%   Vitals:   04/18/23 1004  Weight: 264 lb (119.7 kg)  Height: 5\' 3"  (1.6 m)   Body mass index is 46.77 kg/m.  No results found.   Independent interpretation of notes and tests performed by another provider:   Independent interpretation of right knee x-rays demonstrates severe tricompartmental OA with maximal involvement at the patellofemoral and medial tibiofemoral articulations, prominent superior patellar enthesophytes  Procedures performed:   None  Pertinent History, Exam, Impression, and Recommendations:   Margaret Myers was seen today for knee pain.  Chronic musculoskeletal pain -     DULoxetine HCl; Take 1 capsule (30 mg total) by mouth every evening.  Dispense: 90 capsule; Refill: 0  Primary osteoarthritis of right knee Assessment & Plan: Chronic, ongoing symptomatology. Prior treatments have included numerous corticosteroid injections with diminishing responses, limited to no response from NSAIDs, prior rehab endorsed. Pain anteromedial without radiation, affecting ADLs.  Examination with no effusion, ROM 0-110, limited by pain anteriorly, medial and patellar facet tenderness, no laxity with A/P and valgus/varus stressing, equivocal McMurrays.  We extensively reviewed both surgical and non-surgical treatments after a review of her recent right knee x-ray with interpretation. Plan as follows: - Initiate duloxetine 30 mg, discussed comorbid fluoxetine and possible titration down if needed, goal would be titration of duloxetine to 60 mg for MSK symptom control - Return for evaluation with consideration of genicular nerve block -  Patient to maintain follow-up with orthopedic surgery given her overall clinical course, discussed MI criteria with target BMI 35 and below      Orders & Medications Meds ordered this encounter  Medications   DISCONTD: DULoxetine (CYMBALTA) 30 MG capsule    Sig: Take 1 capsule (30 mg total) by mouth every evening.    Dispense:  90 capsule    Refill:  0   DULoxetine (CYMBALTA) 30 MG capsule    Sig: Take 1 capsule (30 mg total) by mouth every evening.    Dispense:  90 capsule    Refill:  0   No orders of the defined types were placed in this encounter.    No follow-ups on file.     Jerrol Banana, MD, [facility]   Primary Care Sports Medicine Primary Care and Sports Medicine at [facility]

## 2023-04-18 NOTE — Patient Instructions (Addendum)
-   Start duloxetine daily - Contact [facility] with number below - Continue healthy lifestyle changes for weight reduction - Return for knee follow-up  [facility]- Phone: [phone]

## 2023-04-21 ENCOUNTER — Encounter: Payer: Self-pay | Admitting: Family Medicine

## 2023-04-21 ENCOUNTER — Ambulatory Visit (INDEPENDENT_AMBULATORY_CARE_PROVIDER_SITE_OTHER): Payer: BLUE CROSS/BLUE SHIELD | Admitting: Family Medicine

## 2023-04-21 ENCOUNTER — Other Ambulatory Visit (INDEPENDENT_AMBULATORY_CARE_PROVIDER_SITE_OTHER): Payer: BLUE CROSS/BLUE SHIELD | Admitting: Radiology

## 2023-04-21 VITALS — BP 104/78 | HR 68 | Ht 63.0 in | Wt 264.0 lb

## 2023-04-21 DIAGNOSIS — M1711 Unilateral primary osteoarthritis, right knee: Secondary | ICD-10-CM | POA: Diagnosis not present

## 2023-04-27 NOTE — Progress Notes (Signed)
     Primary Care / Sports Medicine Office Visit  Patient Information:  Patient ID: [name], female DOB: 31-Jan-1976 Age: 47 y.o. MRN: [id number]   Margaret Myers is a pleasant 47 y.o. female presenting with the following:  Chief Complaint  Patient presents with   Knee Pain    Right knee follow-up    Vitals:   04/21/23 1446  BP: 104/78  Pulse: 68  SpO2: 99%   Vitals:   04/21/23 1446  Weight: 264 lb (119.7 kg)  Height: 5\' 3"  (1.6 m)   Body mass index is 46.77 kg/m.  No results found.   Independent interpretation of notes and tests performed by another provider:   None  Procedures performed:   Procedure:  Injection of right knee genicular nerves under ultrasound guidance. Ultrasound guidance utilized to evaluate genicular arteries and injections of the right superolateral, superomedial, and inferomedial genicular nerves Samsung HS60 device utilized with permanent recording / reporting. Verbal informed consent obtained and verified. Skin prepped in a sterile fashion. Ethyl chloride for topical local analgesia.  Completed without difficulty and tolerated well. Medication: 7.5 mL lidocaine 1% without epinephrine utilized for nerve block Advised to contact for fevers/chills, erythema, induration, drainage, or persistent bleeding.   Pertinent History, Exam, Impression, and Recommendations:   Margaret Myers was seen today for knee pain.  Primary osteoarthritis of right knee Assessment & Plan: Patient with persistent symptoms, discussed interim options until she see orthopedics. Patient elected to proceed with genicular nerve block.  Pre-procedure pain: 8/10 Post-procedure pain: 0/10  Positive response (>=50% pain reduction), discussed genicular nerve ablation. Will coordinate at patient's discretion.  Orders: -     [place] LIMITED JOINT SPACE STRUCTURES LOW LEFT; Future     Orders & Medications No orders of the defined types were placed in this encounter.  Orders  Placed This Encounter  Procedures   [place] LIMITED JOINT SPACE STRUCTURES LOW LEFT     No follow-ups on file.     Jerrol Banana, MD, [facility]   Primary Care Sports Medicine Primary Care and Sports Medicine at [facility]

## 2023-04-27 NOTE — Patient Instructions (Signed)
Genicular Nerve Blocks A Genicular Nerve Block is a diagnostic injection used to determine whether pain and discomfort stem from inflammation within the knee joint. Diagnostic injections help doctors know whether or not specific treatments can help the knee. The procedure is performed twice, 1 or 2 weeks apart.  Following a Genicular Nerve Block with a positive response, consideration for radiofrequency ablation is made. A radiofrequency ablation allows for longer pain relief for patients with a successful nerve block test.

## 2023-04-27 NOTE — Assessment & Plan Note (Signed)
Patient with persistent symptoms, discussed interim options until she see orthopedics. Patient elected to proceed with genicular nerve block.  Pre-procedure pain: 8/10 Post-procedure pain: 0/10  Positive response (>=50% pain reduction), discussed genicular nerve ablation. Will coordinate at patient's discretion.

## 2023-05-13 ENCOUNTER — Ambulatory Visit: Payer: Medicaid Other | Admitting: Physician Assistant

## 2023-05-16 ENCOUNTER — Ambulatory Visit: Payer: BLUE CROSS/BLUE SHIELD | Admitting: Physician Assistant

## 2023-05-17 ENCOUNTER — Ambulatory Visit: Payer: BLUE CROSS/BLUE SHIELD | Admitting: Physician Assistant

## 2023-05-25 ENCOUNTER — Ambulatory Visit: Payer: BLUE CROSS/BLUE SHIELD | Admitting: Physician Assistant

## 2023-05-26 ENCOUNTER — Encounter: Payer: Self-pay | Admitting: Physician Assistant

## 2023-07-04 ENCOUNTER — Telehealth: Payer: Self-pay

## 2023-07-05 NOTE — Transitions of Care (Post Inpatient/ED Visit) (Signed)
   07/05/2023  Name: Margaret Myers MRN: [id number] DOB: 1976/01/13  Today's TOC FU Call Status: Today's TOC FU Call Status:: Unsuccessful Call (2nd Attempt) Unsuccessful Call (1st Attempt) Date: 07/04/23 Unsuccessful Call (2nd Attempt) Date: 07/05/23  Attempted to reach the patient regarding the most recent Inpatient/ED visit.  Follow Up Plan: Additional outreach attempts will be made to reach the patient to complete the Transitions of Care (Post Inpatient/ED visit) call.   Signature [employer], CMA

## 2023-07-06 NOTE — Transitions of Care (Post Inpatient/ED Visit) (Signed)
   07/06/2023  Name: Margaret Myers MRN: [id number] DOB: 04-01-1976  Today's TOC FU Call Status: Today's TOC FU Call Status:: Unsuccessful Call (3rd Attempt) Unsuccessful Call (1st Attempt) Date: 07/04/23 Unsuccessful Call (2nd Attempt) Date: 07/05/23 Unsuccessful Call (3rd Attempt) Date: 07/06/23  Attempted to reach the patient regarding the most recent Inpatient/ED visit.  Follow Up Plan: No further outreach attempts will be made at this time. We have been unable to contact the patient.  [employer], CMA

## 2023-07-07 ENCOUNTER — Other Ambulatory Visit: Payer: Self-pay

## 2023-07-07 ENCOUNTER — Encounter: Payer: Self-pay | Admitting: Emergency Medicine

## 2023-07-07 ENCOUNTER — Emergency Department
Admission: EM | Admit: 2023-07-07 | Discharge: 2023-07-07 | Disposition: A | Discharge status: Home / Self Care | Payer: BLUE CROSS/BLUE SHIELD | Attending: Emergency Medicine | Admitting: Emergency Medicine

## 2023-07-07 DIAGNOSIS — Z5900 Homelessness unspecified: Secondary | ICD-10-CM | POA: Insufficient documentation

## 2023-07-07 DIAGNOSIS — F4321 Adjustment disorder with depressed mood: Secondary | ICD-10-CM

## 2023-07-07 DIAGNOSIS — G43709 Chronic migraine without aura, not intractable, without status migrainosus: Secondary | ICD-10-CM | POA: Insufficient documentation

## 2023-07-07 DIAGNOSIS — F4323 Adjustment disorder with mixed anxiety and depressed mood: Secondary | ICD-10-CM | POA: Diagnosis not present

## 2023-07-07 DIAGNOSIS — F32A Depression, unspecified: Secondary | ICD-10-CM | POA: Diagnosis present

## 2023-07-07 DIAGNOSIS — F419 Anxiety disorder, unspecified: Secondary | ICD-10-CM | POA: Diagnosis present

## 2023-07-07 DIAGNOSIS — G43909 Migraine, unspecified, not intractable, without status migrainosus: Secondary | ICD-10-CM

## 2023-07-07 DIAGNOSIS — R45851 Suicidal ideations: Secondary | ICD-10-CM

## 2023-07-07 DIAGNOSIS — R519 Headache, unspecified: Secondary | ICD-10-CM | POA: Diagnosis present

## 2023-07-07 LAB — COMPREHENSIVE METABOLIC PANEL
ALT: 18 U/L (ref 0–44)
AST: 21 U/L (ref 15–41)
Albumin: 3.4 g/dL — ABNORMAL LOW (ref 3.5–5.0)
Alkaline Phosphatase: 47 U/L (ref 38–126)
Anion gap: 8 (ref 5–15)
BUN: 15 mg/dL (ref 6–20)
CO2: 27 mmol/L (ref 22–32)
Calcium: 8.4 mg/dL — ABNORMAL LOW (ref 8.9–10.3)
Chloride: 102 mmol/L (ref 98–111)
Creatinine, Ser: 1 mg/dL (ref 0.44–1.00)
GFR, Estimated: >60 mL/min (ref >60)
Glucose, Bld: 133 mg/dL — ABNORMAL HIGH (ref 70–99)
Potassium: 3.3 mmol/L — ABNORMAL LOW (ref 3.5–5.1)
Sodium: 137 mmol/L (ref 135–145)
Total Bilirubin: 0.5 mg/dL (ref 0.3–1.2)
Total Protein: 5.8 g/dL — ABNORMAL LOW (ref 6.5–8.1)

## 2023-07-07 LAB — CBC
HCT: 40.6 % (ref 36.0–46.0)
Hemoglobin: 13.7 g/dL (ref 12.0–15.0)
MCH: 30.9 pg (ref 26.0–34.0)
MCHC: 33.7 g/dL (ref 30.0–36.0)
MCV: 91.4 fL (ref 80.0–100.0)
Platelets: 297 10*3/uL (ref 150–400)
RBC: 4.44 MIL/uL (ref 3.87–5.11)
RDW: 13.2 % (ref 11.5–15.5)
WBC: 11.6 10*3/uL — ABNORMAL HIGH (ref 4.0–10.5)
nRBC: 0 % (ref 0.0–0.2)

## 2023-07-07 LAB — SALICYLATE LEVEL: Salicylate Lvl: <7 mg/dL — ABNORMAL LOW (ref 7.0–30.0)

## 2023-07-07 LAB — ACETAMINOPHEN LEVEL: Acetaminophen (Tylenol), Serum: <10 ug/mL — ABNORMAL LOW (ref 10–30)

## 2023-07-07 LAB — ETHANOL: Alcohol, Ethyl (B): <10 mg/dL (ref <10)

## 2023-07-07 MED ORDER — SUMATRIPTAN SUCCINATE 50 MG PO TABS: 50.0000 mg | ORAL_TABLET | Freq: Every day | ORAL | 0 refills | Status: AC | PRN

## 2023-07-07 NOTE — ED Notes (Signed)
Pt provided a dinner tray ate 100%.

## 2023-07-07 NOTE — ED Triage Notes (Signed)
Patient states, "I am about to have a nervous break down.  I am homeless, I have been living behind [address], and I just cannot take it anymore."    Patient also discloses headache x 2 days, does report hx of migraines.  States she is normally on a medication for migraine prevention but has been out of meds x approximately 1 month.    Ambulatory to triage, NAD noted at this time.

## 2023-07-07 NOTE — ED Provider Notes (Signed)
[facility] Provider Note    Event Date/Time   First MD Initiated Contact with Patient 07/07/23 2309     (approximate)   History   Anxiety, Headache, and Psychiatric Evaluation   HPI  Margaret Myers is a 47 y.o. female here with headache and anxiety as well as depression.  The patient states that she is currently homeless.  This is caused her depression to worsen and she has had thoughts about not wanting to be here.  She has been more anxious.  She presents for psychiatric evaluation.  She also states she has been having daily headaches and migraines.  She normally takes Imitrex but has been out of this for the last few weeks.  Denies any focal numbness or weakness.  No other medical complaints.     Physical Exam   Triage Vital Signs: ED Triage Vitals  Encounter Vitals Group     BP 07/07/23 2107 128/83     Systolic BP Percentile --      Diastolic BP Percentile --      Pulse Rate 07/07/23 2107 (!) 102     Resp 07/07/23 2107 20     Temp 07/07/23 2107 99 F (37.2 C)     Temp Source 07/07/23 2107 Oral     SpO2 07/07/23 2107 96 %     Weight 07/07/23 2117 250 lb (113.4 kg)     Height 07/07/23 2117 5\' 3"  (1.6 m)     Head Circumference --      Peak Flow --      Pain Score 07/07/23 2117 7     Pain Loc --      Pain Education --      Exclude from Growth Chart --     Most recent vital signs: Vitals:   07/07/23 2107  BP: 128/83  Pulse: (!) 102  Resp: 20  Temp: 99 F (37.2 C)  SpO2: 96%     General: Awake, no distress.  CV:  Good peripheral perfusion.  Regular rate and rhythm. Resp:  Normal work of breathing.  Abd:  No distention.  Other:  Alert, oriented x 4.  Cranials 2 through 12 intact.  Strength out of 5 upper and lower extremities with normal sensation to light touch.  Denies overt SI or HI.  Calm, cooperative.   ED Results / Procedures / Treatments   Labs (all labs ordered are listed, but only abnormal results are displayed) Labs  Reviewed  COMPREHENSIVE METABOLIC PANEL - Abnormal; Notable for the following components:      Result Value   Potassium 3.3 (*)    Glucose, Bld 133 (*)    Calcium 8.4 (*)    Total Protein 5.8 (*)    Albumin 3.4 (*)    All other components within normal limits  SALICYLATE LEVEL - Abnormal; Notable for the following components:   Salicylate Lvl <7.0 (*)    All other components within normal limits  ACETAMINOPHEN LEVEL - Abnormal; Notable for the following components:   Acetaminophen (Tylenol), Serum <10 (*)    All other components within normal limits  CBC - Abnormal; Notable for the following components:   WBC 11.6 (*)    All other components within normal limits  ETHANOL  URINE DRUG SCREEN, QUALITATIVE ([facility] ONLY)  POC URINE PREG, ED     EKG    RADIOLOGY    I also independently reviewed and agree with radiologist interpretations.   PROCEDURES:  Critical Care performed: No  MEDICATIONS ORDERED IN ED: Medications - No data to display   IMPRESSION / MDM / ASSESSMENT AND PLAN / ED COURSE  I reviewed the triage vital signs and the nursing notes.                              Differential diagnosis includes, but is not limited to, adjustment do, poor social situation, chronic migraines, chronic pain.  Patient's presentation is most consistent with acute presentation with potential threat to life or bodily function.  The patient is on the cardiac monitor to evaluate for evidence of arrhythmia and/or significant heart rate changes  47 yo F here with reported worsening depression, SI. Pt sx seem chronic and somewhat situational. Labs overall reassuring. She has HA but this is a chronic issue that is well documented. No focal neuro deficits. Will refill her home imitrex prn and refer to neuro. Otherwise, psych has evaluated and cleared for d/c. She has no imminent risk factors or plans for self harm and does not meet inpt or IVC criteria. D/c with friend who is here to  pick her up.    FINAL CLINICAL IMPRESSION(S) / ED DIAGNOSES   Final diagnoses:  Depression, unspecified depression type  Adjustment disorder with depressed mood  Chronic migraine w/o aura w/o status migrainosus, not intractable     Rx / DC Orders   ED Discharge Orders          Ordered    SUMAtriptan (IMITREX) 50 MG tablet  Daily PRN        07/07/23 2316             Note:  This document was prepared using Dragon voice recognition software and may include unintentional dictation errors.   Shaune Pollack, MD 07/07/23 2342

## 2023-07-07 NOTE — ED Notes (Addendum)
Patient dressed out in hospital provided clothing.  Belongings placed in labeled bag and handed off to [employer], [place].    Belongings include:  Read D[employer] Underwear Bra Tshirt [employer] Black Book Proofreader Bottle Rings (2)

## 2023-07-07 NOTE — ED Triage Notes (Signed)
Throughout triage, patient discloses SI, denies any plan, states, "just thoughts."

## 2023-07-08 ENCOUNTER — Telehealth: Payer: Self-pay

## 2023-07-08 NOTE — Transitions of Care (Post Inpatient/ED Visit) (Signed)
   07/08/2023  Name: Taliya Mcclard MRN: [id number] DOB: 11-03-1976  Today's TOC FU Call Status: Today's TOC FU Call Status:: Unsuccessful Call (1st Attempt) Unsuccessful Call (1st Attempt) Date: 07/08/23  Attempted to reach the patient regarding the most recent Inpatient/ED visit.  Follow Up Plan: Additional outreach attempts will be made to reach the patient to complete the Transitions of Care (Post Inpatient/ED visit) call.   [employer], CMA

## 2023-07-11 ENCOUNTER — Telehealth: Payer: Self-pay

## 2023-07-11 NOTE — Transitions of Care (Post Inpatient/ED Visit) (Unsigned)
   07/11/2023  Name: Margaret Myers MRN: [id number] DOB: 31-Dec-1975  Today's TOC FU Call Status: Today's TOC FU Call Status:: Unsuccessful Call (2nd Attempt) Unsuccessful Call (2nd Attempt) Date: 07/11/23  Attempted to reach the patient regarding the most recent Inpatient/ED visit.  Follow Up Plan: Additional outreach attempts will be made to reach the patient to complete the Transitions of Care (Post Inpatient/ED visit) call.   [employer] Caydence Enck

## 2023-07-12 NOTE — Transitions of Care (Post Inpatient/ED Visit) (Signed)
   07/12/2023  Name: Margaret Myers MRN: [id number] DOB: 1976-03-27  Today's TOC FU Call Status: Today's TOC FU Call Status:: Unsuccessful Call (2nd Attempt) Unsuccessful Call (2nd Attempt) Date: 07/11/23 Unsuccessful Call (3rd Attempt) Date: 07/12/23  Attempted to reach the patient regarding the most recent Inpatient/ED visit.  Follow Up Plan: No further outreach attempts will be made at this time. We have been unable to contact the patient.  [employer], CMA

## 2024-02-13 IMAGING — CR DG KNEE COMPLETE 4+V*R*
4 series · 4 of 4 positions shown · non-contrast
Comparison: None.

CLINICAL DATA: Fall

EXAM:
RIGHT KNEE - COMPLETE 4+ VIEW

[knee ap]
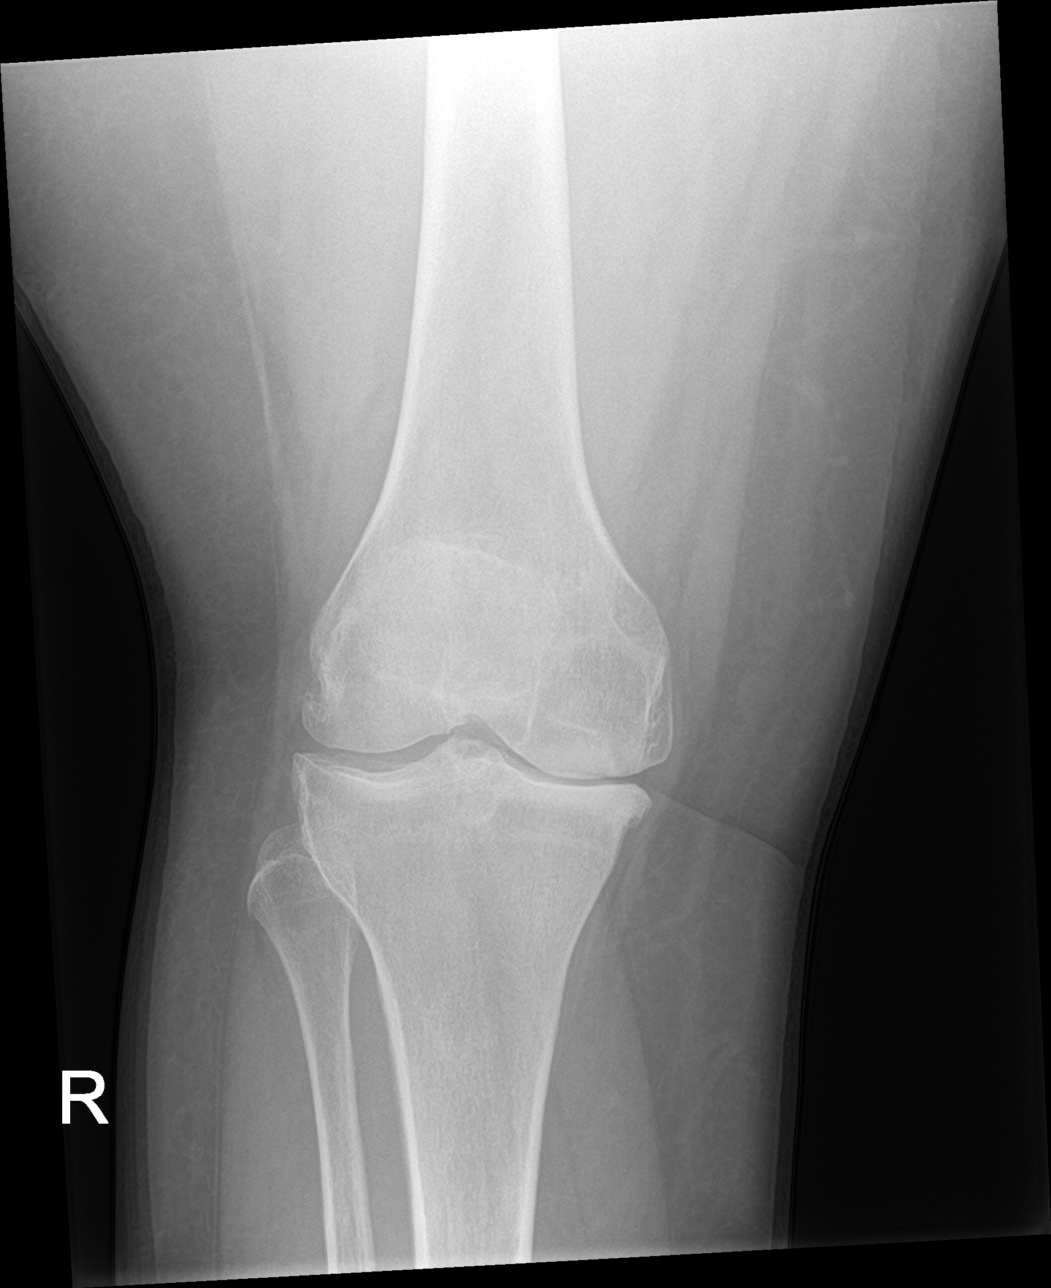

[knee obl (1 of 2)]
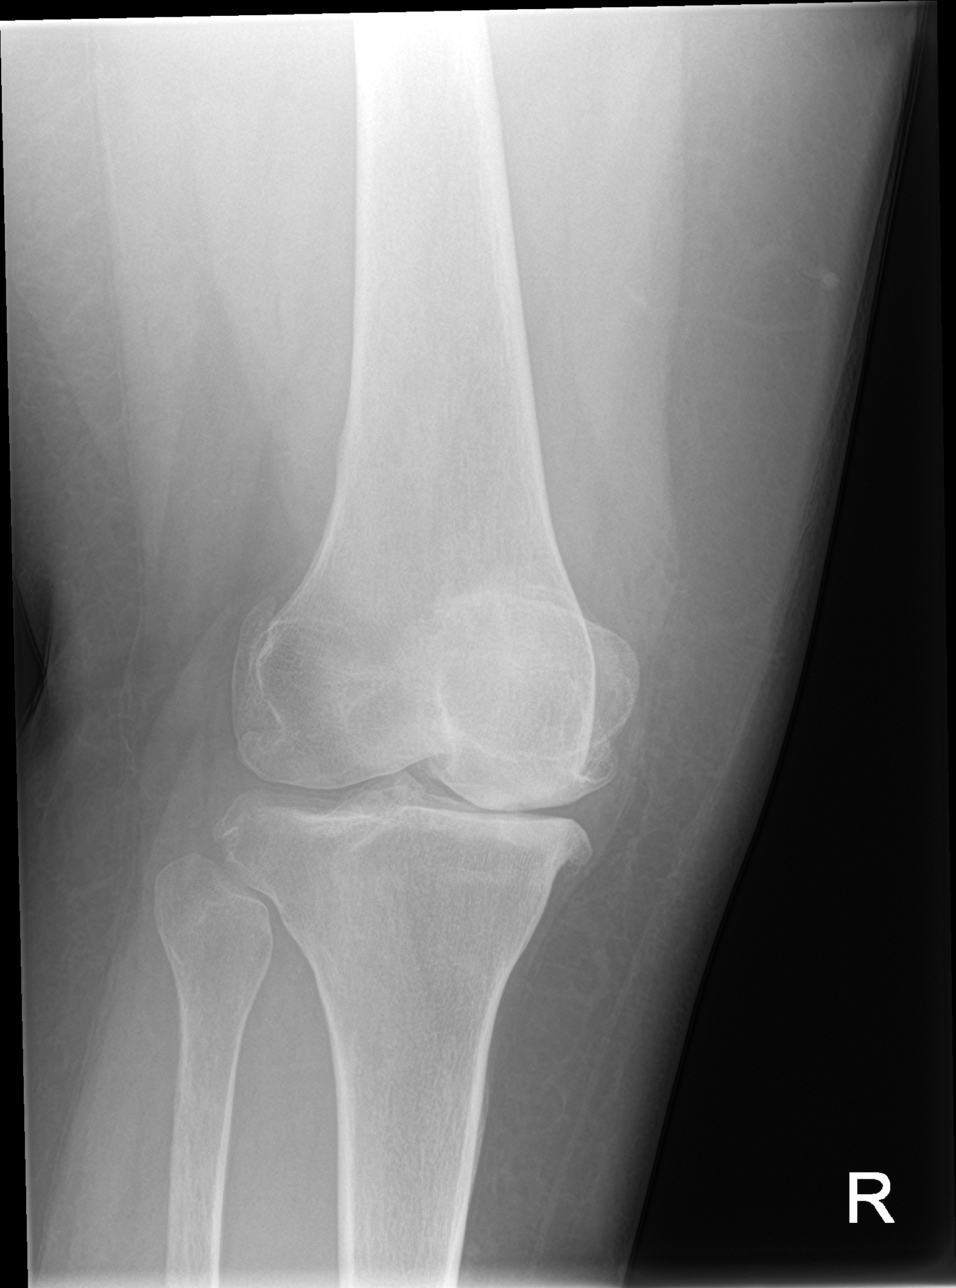

[knee obl (2 of 2)]
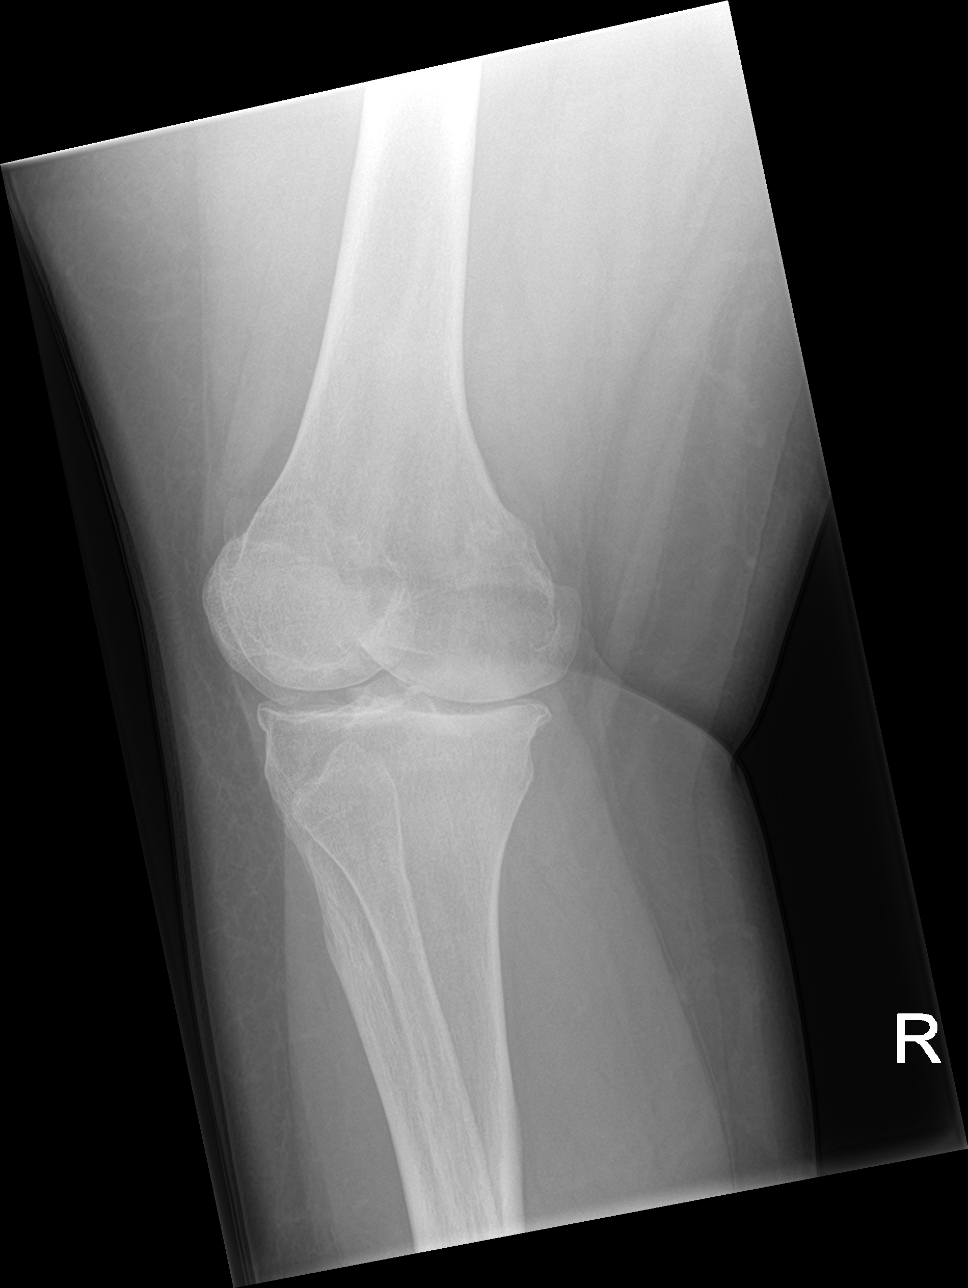

[knee lat]
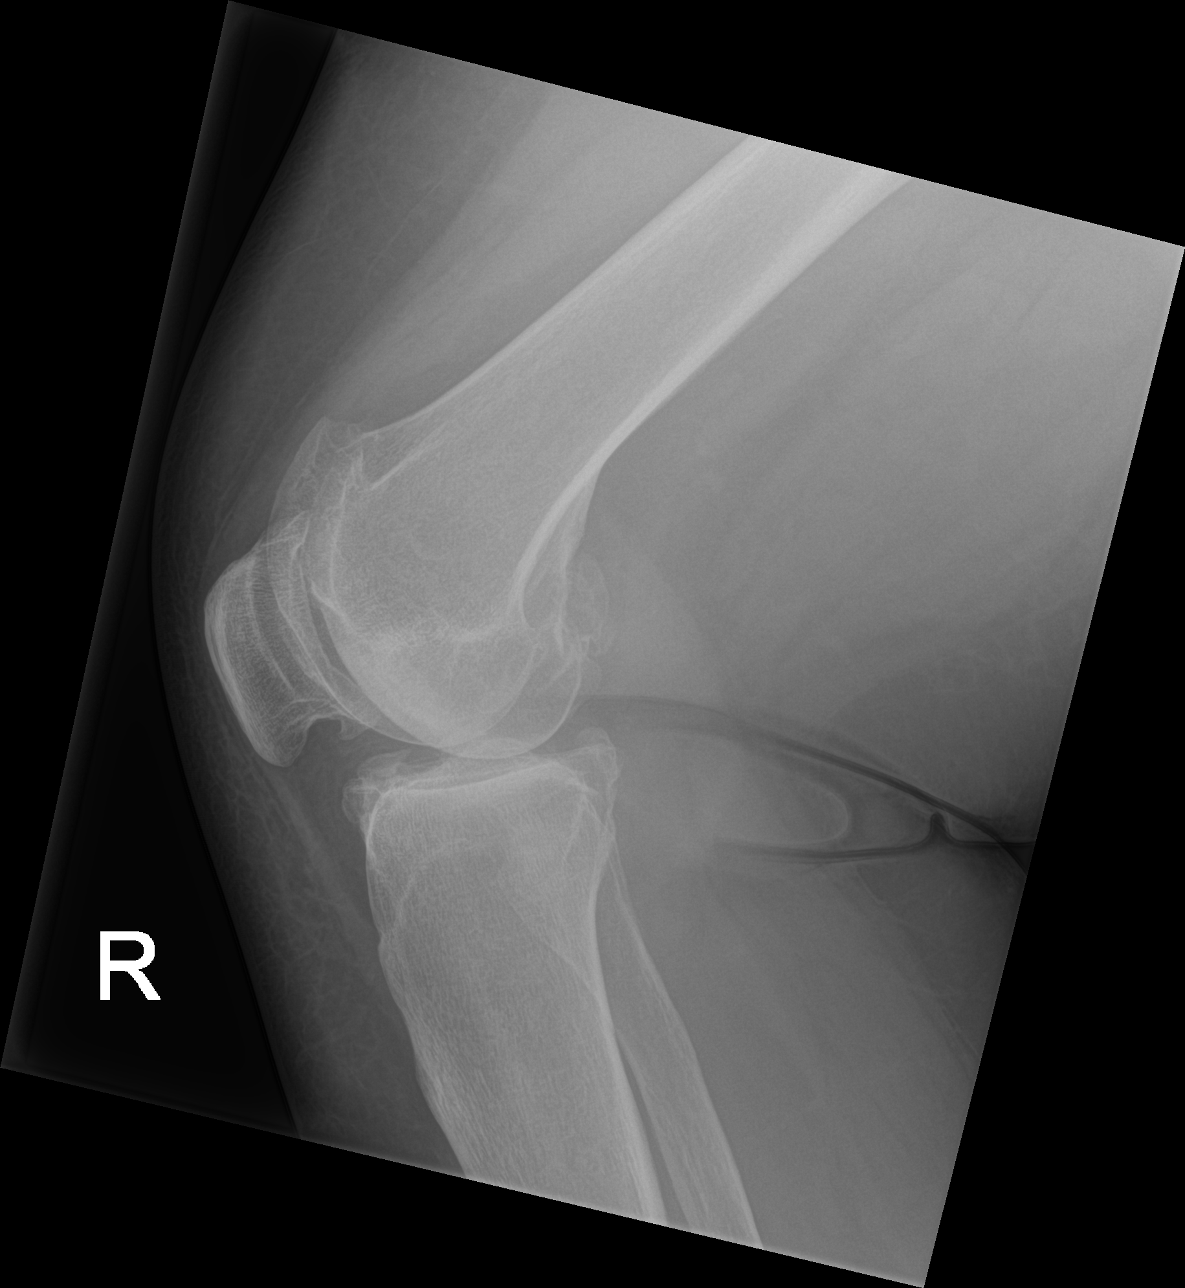

[4 of 4 positions shown; findings below may reference images not displayed]

FINDINGS: There is an irregular osseous fragment adjacent to the anterior
surface of the lateral femoral condyle. No joint effusion.
IMPRESSION: Irregular osseous fragment adjacent to the anterior surface of the
lateral femoral condyle, indeterminate. Lack of joint effusion
argues against acute fracture. If clinical concern for fracture, CT
may be helpful.

## 2024-02-24 ENCOUNTER — Emergency Department

## 2024-02-24 ENCOUNTER — Emergency Department
Admission: EM | Admit: 2024-02-24 | Discharge: 2024-02-24 | Disposition: A | Discharge status: Home / Self Care | Attending: Emergency Medicine | Admitting: Emergency Medicine

## 2024-02-24 ENCOUNTER — Other Ambulatory Visit: Payer: Self-pay

## 2024-02-24 DIAGNOSIS — S6991XA Unspecified injury of right wrist, hand and finger(s), initial encounter: Secondary | ICD-10-CM | POA: Diagnosis present

## 2024-02-24 DIAGNOSIS — W010XXA Fall on same level from slipping, tripping and stumbling without subsequent striking against object, initial encounter: Secondary | ICD-10-CM | POA: Insufficient documentation

## 2024-02-24 DIAGNOSIS — S63501A Unspecified sprain of right wrist, initial encounter: Secondary | ICD-10-CM | POA: Diagnosis not present

## 2024-02-24 NOTE — ED Triage Notes (Signed)
 Pt to ED for fall today while jumping over a mattress and falling on the mattress. Reports right hand pain from fall. Denies hitting head or LOC

## 2024-02-24 NOTE — ED Provider Notes (Addendum)
 [facility] Provider Note    Event Date/Time   First MD Initiated Contact with Patient 02/24/24 1017     (approximate)   History   Fall   HPI Margaret Myers is a 48 y.o. female presenting today for fall with right wrist injury.  Patient states she was walking into a building when she tripped and landed on her right wrist and hand.  She notes most of the pain at the base of her right thumb.  Mild amount of pain around the wrist.  Denies any pain to the fingers or anywhere else to the right upper extremity.  Did not hit her head and no other pain symptoms elsewhere on her body.  Denies any numbness or tingling.     Physical Exam   Triage Vital Signs: ED Triage Vitals  Encounter Vitals Group     BP 02/24/24 1014 110/61     Systolic BP Percentile --      Diastolic BP Percentile --      Pulse Rate 02/24/24 1014 65     Resp 02/24/24 1012 18     Temp 02/24/24 1012 98.7 F (37.1 C)     Temp src --      SpO2 02/24/24 1014 100 %     Weight 02/24/24 1013 280 lb (127 kg)     Height 02/24/24 1013 5\' 3"  (1.6 m)     Head Circumference --      Peak Flow --      Pain Score 02/24/24 1012 8     Pain Loc --      Pain Education --      Exclude from Growth Chart --     Most recent vital signs: Vitals:   02/24/24 1012 02/24/24 1014  BP:  110/61  Pulse:  65  Resp: 18   Temp: 98.7 F (37.1 C)   SpO2:  100%   I have reviewed the vital signs. General:  Awake, alert, no acute distress. Head:  Normocephalic, Atraumatic. EENT:  PERRL, EOMI, Oral mucosa pink and moist, Neck is supple. Cardiovascular: Regular rate, 2+ distal pulses. Respiratory:  Normal respiratory effort, symmetrical expansion, no distress.   Extremities: Mild tenderness palpation to the base of the palmar surface of the right thumb without obvious deformity.  Mild swelling in the area with slight tenderness palpation around the wrist.  No tenderness palpation to the fingers or anywhere else on the  right upper extremity.  Fully able to move all fingers. Neuro:  Alert and oriented.  Interacting appropriately.   Skin:  Warm, dry, no rash.   Psych: Appropriate affect.    ED Results / Procedures / Treatments   Labs (all labs ordered are listed, but only abnormal results are displayed) Labs Reviewed - No data to display   EKG    RADIOLOGY Independently interpreted x-rays with no acute pathology   PROCEDURES:  Critical Care performed: No  Procedures   MEDICATIONS ORDERED IN ED: Medications - No data to display   IMPRESSION / MDM / ASSESSMENT AND PLAN / ED COURSE  I reviewed the triage vital signs and the nursing notes.                              Differential diagnosis includes, but is not limited to, metacarpal fracture, carpal fracture, distal radius/ulnar fracture, soft tissue hematoma, joint sprain  Patient's presentation is most consistent with acute complicated illness /  injury requiring diagnostic workup.  Patient is a 48 year old female presenting today for fall with right hand/wrist injury.  Neurovascular intact throughout right upper extremity.  X-rays of right wrist and right hand show no acute osseous injuries.  Suspect more likely sprain versus soft tissue hematoma as a source of her pain.  Otherwise safe for discharge with symptomatic management at home and follow-up with PCP as needed.     FINAL CLINICAL IMPRESSION(S) / ED DIAGNOSES   Final diagnoses:  Sprain of right wrist, initial encounter     Rx / DC Orders   ED Discharge Orders     None        Note:  This document was prepared using Dragon voice recognition software and may include unintentional dictation errors.   Janith Lima, MD 02/24/24 1056    Janith Lima, MD 02/24/24 [phone]

## 2024-02-24 NOTE — Discharge Instructions (Signed)
 X-ray showed no evidence of any broken bones today.  I suspect more likely a sprain around your wrist or thumb.  You can use simple over-the-counter medications like ibuprofen and Tylenol to help with any pain symptoms.  You can apply ice to the area that may help with swelling as well.
